# Patient Record
Sex: Female | Born: 1970 | Race: White | Hispanic: No | Marital: Married | Smoking: Never smoker
Health system: Southern US, Community
[De-identification: ages and names within clinical notes are randomized; demographics above are authoritative.]

## PROBLEM LIST (undated history)

## (undated) DIAGNOSIS — M810 Age-related osteoporosis without current pathological fracture: Secondary | ICD-10-CM

## (undated) DIAGNOSIS — T8859XA Other complications of anesthesia, initial encounter: Secondary | ICD-10-CM

## (undated) DIAGNOSIS — E349 Endocrine disorder, unspecified: Secondary | ICD-10-CM

## (undated) DIAGNOSIS — K5090 Crohn's disease, unspecified, without complications: Secondary | ICD-10-CM

## (undated) DIAGNOSIS — T4145XA Adverse effect of unspecified anesthetic, initial encounter: Secondary | ICD-10-CM

## (undated) DIAGNOSIS — R011 Cardiac murmur, unspecified: Secondary | ICD-10-CM

## (undated) DIAGNOSIS — K219 Gastro-esophageal reflux disease without esophagitis: Secondary | ICD-10-CM

## (undated) HISTORY — PX: BREAST ENHANCEMENT SURGERY: SHX7

## (undated) HISTORY — DX: Cardiac murmur, unspecified: R01.1

## (undated) HISTORY — PX: ABDOMINAL HYSTERECTOMY: SHX81

## (undated) HISTORY — PX: NASAL SEPTUM SURGERY: SHX37

## (undated) HISTORY — DX: Endocrine disorder, unspecified: E34.9

## (undated) HISTORY — PX: AUGMENTATION MAMMAPLASTY: SUR837

## (undated) HISTORY — DX: Age-related osteoporosis without current pathological fracture: M81.0

## (undated) SURGERY — Surgical Case
Anesthesia: *Unknown

---

## 1997-09-13 ENCOUNTER — Other Ambulatory Visit: Admission: RE | Admit: 1997-09-13 | Discharge: 1997-09-13 | Payer: Self-pay | Admitting: Gynecology

## 1998-05-14 ENCOUNTER — Inpatient Hospital Stay (HOSPITAL_COMMUNITY): Admission: AD | Admit: 1998-05-14 | Discharge: 1998-05-16 | Payer: Self-pay | Admitting: Obstetrics & Gynecology

## 1998-06-12 ENCOUNTER — Other Ambulatory Visit: Admission: RE | Admit: 1998-06-12 | Discharge: 1998-06-12 | Payer: Self-pay | Admitting: Obstetrics & Gynecology

## 1998-11-07 ENCOUNTER — Encounter: Admission: RE | Admit: 1998-11-07 | Discharge: 1999-02-05 | Payer: Self-pay | Admitting: Sports Medicine

## 1998-11-26 ENCOUNTER — Encounter: Payer: Self-pay | Admitting: Emergency Medicine

## 1998-11-26 ENCOUNTER — Emergency Department (HOSPITAL_COMMUNITY): Admission: EM | Admit: 1998-11-26 | Discharge: 1998-11-26 | Payer: Self-pay | Admitting: Emergency Medicine

## 1999-09-23 ENCOUNTER — Other Ambulatory Visit: Admission: RE | Admit: 1999-09-23 | Discharge: 1999-09-23 | Payer: Self-pay | Admitting: Obstetrics and Gynecology

## 2001-03-07 ENCOUNTER — Encounter (INDEPENDENT_AMBULATORY_CARE_PROVIDER_SITE_OTHER): Payer: Self-pay | Admitting: Specialist

## 2001-03-07 ENCOUNTER — Inpatient Hospital Stay (HOSPITAL_COMMUNITY): Admission: AD | Admit: 2001-03-07 | Discharge: 2001-03-09 | Payer: Self-pay | Admitting: Obstetrics and Gynecology

## 2001-04-19 ENCOUNTER — Other Ambulatory Visit: Admission: RE | Admit: 2001-04-19 | Discharge: 2001-04-19 | Payer: Self-pay | Admitting: Obstetrics & Gynecology

## 2002-05-24 ENCOUNTER — Other Ambulatory Visit: Admission: RE | Admit: 2002-05-24 | Discharge: 2002-05-24 | Payer: Self-pay | Admitting: Obstetrics and Gynecology

## 2003-03-14 ENCOUNTER — Encounter: Admission: RE | Admit: 2003-03-14 | Discharge: 2003-03-14 | Payer: Self-pay | Admitting: Obstetrics and Gynecology

## 2003-06-14 ENCOUNTER — Other Ambulatory Visit: Admission: RE | Admit: 2003-06-14 | Discharge: 2003-06-14 | Payer: Self-pay | Admitting: Obstetrics and Gynecology

## 2003-11-15 ENCOUNTER — Encounter: Admission: RE | Admit: 2003-11-15 | Discharge: 2003-11-15 | Payer: Self-pay | Admitting: Allergy and Immunology

## 2004-02-16 ENCOUNTER — Ambulatory Visit: Payer: Self-pay | Admitting: Internal Medicine

## 2004-06-28 ENCOUNTER — Other Ambulatory Visit: Admission: RE | Admit: 2004-06-28 | Discharge: 2004-06-28 | Payer: Self-pay | Admitting: Obstetrics and Gynecology

## 2007-07-19 ENCOUNTER — Encounter: Admission: RE | Admit: 2007-07-19 | Discharge: 2007-07-19 | Payer: Self-pay | Admitting: Internal Medicine

## 2010-08-29 ENCOUNTER — Other Ambulatory Visit: Payer: Self-pay | Admitting: Obstetrics and Gynecology

## 2010-08-29 DIAGNOSIS — D249 Benign neoplasm of unspecified breast: Secondary | ICD-10-CM

## 2010-09-05 ENCOUNTER — Ambulatory Visit
Admission: RE | Admit: 2010-09-05 | Discharge: 2010-09-05 | Disposition: A | Discharge status: Home / Self Care | Payer: BC Managed Care – PPO | Source: Ambulatory Visit | Attending: Obstetrics and Gynecology | Admitting: Obstetrics and Gynecology

## 2010-09-05 DIAGNOSIS — D249 Benign neoplasm of unspecified breast: Secondary | ICD-10-CM

## 2011-04-02 ENCOUNTER — Other Ambulatory Visit: Payer: Self-pay | Admitting: Dermatology

## 2011-09-02 ENCOUNTER — Other Ambulatory Visit: Payer: Self-pay | Admitting: Obstetrics and Gynecology

## 2014-04-07 ENCOUNTER — Encounter (HOSPITAL_COMMUNITY): Payer: Self-pay | Admitting: Obstetrics

## 2014-04-07 ENCOUNTER — Ambulatory Visit (HOSPITAL_COMMUNITY): Payer: BLUE CROSS/BLUE SHIELD | Admitting: Anesthesiology

## 2014-04-07 ENCOUNTER — Encounter (HOSPITAL_COMMUNITY)
Admission: RE | Disposition: A | Discharge status: Home / Self Care | Payer: Self-pay | Source: Ambulatory Visit | Attending: Obstetrics

## 2014-04-07 ENCOUNTER — Ambulatory Visit (HOSPITAL_COMMUNITY)
Admission: RE | Admit: 2014-04-07 | Discharge: 2014-04-07 | Disposition: A | Discharge status: Home / Self Care | Payer: BLUE CROSS/BLUE SHIELD | Source: Ambulatory Visit | Attending: Obstetrics | Admitting: Obstetrics

## 2014-04-07 DIAGNOSIS — Z302 Encounter for sterilization: Secondary | ICD-10-CM | POA: Diagnosis present

## 2014-04-07 DIAGNOSIS — N920 Excessive and frequent menstruation with regular cycle: Secondary | ICD-10-CM | POA: Insufficient documentation

## 2014-04-07 HISTORY — PX: NOVASURE ABLATION: SHX5394

## 2014-04-07 HISTORY — PX: LAPAROSCOPIC TUBAL LIGATION: SHX1937

## 2014-04-07 HISTORY — DX: Crohn's disease, unspecified, without complications: K50.90

## 2014-04-07 LAB — CBC
HCT: 39.9 % (ref 36.0–46.0)
HEMOGLOBIN: 13.6 g/dL (ref 12.0–15.0)
MCH: 31 pg (ref 26.0–34.0)
MCHC: 34.1 g/dL (ref 30.0–36.0)
MCV: 90.9 fL (ref 78.0–100.0)
Platelets: 334 10*3/uL (ref 150–400)
RBC: 4.39 MIL/uL (ref 3.87–5.11)
RDW: 12.9 % (ref 11.5–15.5)
WBC: 11.3 10*3/uL — ABNORMAL HIGH (ref 4.0–10.5)

## 2014-04-07 LAB — PREGNANCY, URINE: Preg Test, Ur: NEGATIVE

## 2014-04-07 SURGERY — LIGATION, FALLOPIAN TUBE, LAPAROSCOPIC
Anesthesia: General

## 2014-04-07 MED ORDER — GLYCOPYRROLATE 0.2 MG/ML IJ SOLN
INTRAMUSCULAR | Status: AC
  Filled 2014-04-07: qty 3

## 2014-04-07 MED ORDER — IBUPROFEN 600 MG PO TABS: 600.0000 mg | ORAL_TABLET | Freq: Four times a day (QID) | ORAL | Status: DC | PRN

## 2014-04-07 MED ORDER — FENTANYL CITRATE 0.05 MG/ML IJ SOLN
INTRAMUSCULAR | Status: AC
  Filled 2014-04-07: qty 2

## 2014-04-07 MED ORDER — ROCURONIUM BROMIDE 100 MG/10ML IV SOLN
INTRAVENOUS | Status: DC | PRN
  Administered 2014-04-07: 30 mg via INTRAVENOUS

## 2014-04-07 MED ORDER — PROPOFOL 10 MG/ML IV BOLUS
INTRAVENOUS | Status: DC | PRN
Start: 2014-04-07 — End: 2014-04-07
  Administered 2014-04-07: 200 mg via INTRAVENOUS

## 2014-04-07 MED ORDER — OXYCODONE-ACETAMINOPHEN 5-325 MG PO TABS
ORAL_TABLET | ORAL | Status: AC
  Filled 2014-04-07: qty 1

## 2014-04-07 MED ORDER — MIDAZOLAM HCL 2 MG/2ML IJ SOLN
INTRAMUSCULAR | Status: AC
  Filled 2014-04-07: qty 2

## 2014-04-07 MED ORDER — ONDANSETRON HCL 4 MG/2ML IJ SOLN
INTRAMUSCULAR | Status: DC | PRN
  Administered 2014-04-07: 4 mg via INTRAVENOUS

## 2014-04-07 MED ORDER — DEXAMETHASONE SODIUM PHOSPHATE 10 MG/ML IJ SOLN
INTRAMUSCULAR | Status: DC | PRN
  Administered 2014-04-07: 10 mg via INTRAVENOUS

## 2014-04-07 MED ORDER — LACTATED RINGERS IV SOLN: INTRAVENOUS | Status: DC

## 2014-04-07 MED ORDER — NEOSTIGMINE METHYLSULFATE 10 MG/10ML IV SOLN
INTRAVENOUS | Status: DC | PRN
  Administered 2014-04-07: 3 mg via INTRAVENOUS

## 2014-04-07 MED ORDER — OXYCODONE-ACETAMINOPHEN 5-325 MG PO TABS
1.0000 | ORAL_TABLET | Freq: Once | ORAL | Status: AC
  Administered 2014-04-07: 1 via ORAL

## 2014-04-07 MED ORDER — LACTATED RINGERS IV SOLN
INTRAVENOUS | Status: DC
  Administered 2014-04-07 (×2): via INTRAVENOUS

## 2014-04-07 MED ORDER — PROPOFOL 10 MG/ML IV BOLUS
INTRAVENOUS | Status: AC
  Filled 2014-04-07: qty 20

## 2014-04-07 MED ORDER — FENTANYL CITRATE 0.05 MG/ML IJ SOLN
INTRAMUSCULAR | Status: DC | PRN
  Administered 2014-04-07: 50 ug via INTRAVENOUS
  Administered 2014-04-07 (×2): 100 ug via INTRAVENOUS
  Administered 2014-04-07: 50 ug via INTRAVENOUS

## 2014-04-07 MED ORDER — SCOPOLAMINE 1 MG/3DAYS TD PT72
1.0000 | MEDICATED_PATCH | Freq: Once | TRANSDERMAL | Status: DC
  Administered 2014-04-07: 1.5 mg via TRANSDERMAL

## 2014-04-07 MED ORDER — KETOROLAC TROMETHAMINE 30 MG/ML IJ SOLN
INTRAMUSCULAR | Status: AC
  Filled 2014-04-07: qty 1

## 2014-04-07 MED ORDER — GLYCOPYRROLATE 0.2 MG/ML IJ SOLN
INTRAMUSCULAR | Status: DC | PRN
  Administered 2014-04-07: 0.6 mg via INTRAVENOUS

## 2014-04-07 MED ORDER — ROCURONIUM BROMIDE 100 MG/10ML IV SOLN
INTRAVENOUS | Status: AC
  Filled 2014-04-07: qty 1

## 2014-04-07 MED ORDER — LIDOCAINE HCL 1 % IJ SOLN
INTRAMUSCULAR | Status: AC
  Filled 2014-04-07: qty 20

## 2014-04-07 MED ORDER — KETOROLAC TROMETHAMINE 30 MG/ML IJ SOLN
INTRAMUSCULAR | Status: DC | PRN
  Administered 2014-04-07: 30 mg via INTRAVENOUS

## 2014-04-07 MED ORDER — OXYCODONE-ACETAMINOPHEN 5-325 MG PO TABS: 1.0000 | ORAL_TABLET | Freq: Four times a day (QID) | ORAL | Status: DC | PRN

## 2014-04-07 MED ORDER — NEOSTIGMINE METHYLSULFATE 10 MG/10ML IV SOLN
INTRAVENOUS | Status: AC
  Filled 2014-04-07: qty 1

## 2014-04-07 MED ORDER — MIDAZOLAM HCL 2 MG/2ML IJ SOLN: 0.5000 mg | Freq: Once | INTRAMUSCULAR | Status: DC | PRN

## 2014-04-07 MED ORDER — BUPIVACAINE HCL (PF) 0.25 % IJ SOLN
INTRAMUSCULAR | Status: DC | PRN
  Administered 2014-04-07: 4 mL

## 2014-04-07 MED ORDER — ACETAMINOPHEN 325 MG PO TABS: 325.0000 mg | ORAL_TABLET | ORAL | Status: DC | PRN

## 2014-04-07 MED ORDER — ONDANSETRON HCL 4 MG/2ML IJ SOLN
INTRAMUSCULAR | Status: AC
  Filled 2014-04-07: qty 2

## 2014-04-07 MED ORDER — LIDOCAINE HCL (CARDIAC) 20 MG/ML IV SOLN
INTRAVENOUS | Status: DC | PRN
  Administered 2014-04-07: 80 mg via INTRAVENOUS

## 2014-04-07 MED ORDER — PROMETHAZINE HCL 25 MG/ML IJ SOLN: 6.2500 mg | INTRAMUSCULAR | Status: DC | PRN

## 2014-04-07 MED ORDER — LIDOCAINE HCL (CARDIAC) 20 MG/ML IV SOLN
INTRAVENOUS | Status: AC
  Filled 2014-04-07: qty 5

## 2014-04-07 MED ORDER — DEXAMETHASONE SODIUM PHOSPHATE 10 MG/ML IJ SOLN
INTRAMUSCULAR | Status: AC
  Filled 2014-04-07: qty 1

## 2014-04-07 MED ORDER — FENTANYL CITRATE 0.05 MG/ML IJ SOLN
25.0000 ug | INTRAMUSCULAR | Status: DC | PRN
  Administered 2014-04-07 (×2): 25 ug via INTRAVENOUS
  Administered 2014-04-07: 50 ug via INTRAVENOUS

## 2014-04-07 MED ORDER — ACETAMINOPHEN 160 MG/5ML PO SOLN: 325.0000 mg | ORAL | Status: DC | PRN

## 2014-04-07 MED ORDER — MEPERIDINE HCL 25 MG/ML IJ SOLN: 6.2500 mg | INTRAMUSCULAR | Status: DC | PRN

## 2014-04-07 MED ORDER — KETOROLAC TROMETHAMINE 30 MG/ML IJ SOLN: 30.0000 mg | Freq: Once | INTRAMUSCULAR | Status: DC | PRN

## 2014-04-07 MED ORDER — SCOPOLAMINE 1 MG/3DAYS TD PT72
MEDICATED_PATCH | TRANSDERMAL | Status: AC
  Administered 2014-04-07: 1.5 mg via TRANSDERMAL
  Filled 2014-04-07: qty 1

## 2014-04-07 MED ORDER — FENTANYL CITRATE 0.05 MG/ML IJ SOLN
INTRAMUSCULAR | Status: AC
  Filled 2014-04-07: qty 5

## 2014-04-07 MED ORDER — MIDAZOLAM HCL 2 MG/2ML IJ SOLN
INTRAMUSCULAR | Status: DC | PRN
  Administered 2014-04-07: 2 mg via INTRAVENOUS

## 2014-04-07 MED ORDER — BUPIVACAINE HCL (PF) 0.25 % IJ SOLN
INTRAMUSCULAR | Status: AC
  Filled 2014-04-07: qty 30

## 2014-04-07 SURGICAL SUPPLY — 29 items
ABLATOR ENDOMETRIAL BIPOLAR (ABLATOR) ×2 IMPLANT
CATH ROBINSON RED A/P 16FR (CATHETERS) ×2 IMPLANT
CLIP FILSHIE TUBAL LIGA STRL (Clip) ×1 IMPLANT
CLOTH BEACON ORANGE TIMEOUT ST (SAFETY) ×2 IMPLANT
CONTAINER PREFILL 10% NBF 60ML (FORM) ×4 IMPLANT
DRSG COVADERM PLUS 2X2 (GAUZE/BANDAGES/DRESSINGS) ×4 IMPLANT
DRSG OPSITE POSTOP 3X4 (GAUZE/BANDAGES/DRESSINGS) ×1 IMPLANT
GLOVE BIO SURGEON STRL SZ 6 (GLOVE) ×2 IMPLANT
GLOVE BIO SURGEON STRL SZ7 (GLOVE) ×1 IMPLANT
GLOVE BIOGEL PI IND STRL 6 (GLOVE) ×1 IMPLANT
GLOVE BIOGEL PI IND STRL 7.0 (GLOVE) IMPLANT
GLOVE BIOGEL PI INDICATOR 6 (GLOVE) ×1
GLOVE BIOGEL PI INDICATOR 7.0 (GLOVE) ×1
GLOVE INDICATOR 6.5 STRL GRN (GLOVE) ×2 IMPLANT
GLOVE INDICATOR 7.0 STRL GRN (GLOVE) ×4 IMPLANT
GOWN STRL REUS W/TWL LRG LVL3 (GOWN DISPOSABLE) ×6 IMPLANT
LIQUID BAND (GAUZE/BANDAGES/DRESSINGS) ×2 IMPLANT
PACK LAPAROSCOPY BASIN (CUSTOM PROCEDURE TRAY) ×2 IMPLANT
PACK VAGINAL MINOR WOMEN LF (CUSTOM PROCEDURE TRAY) ×2 IMPLANT
PAD POSITIONER PINK NONSTERILE (MISCELLANEOUS) ×2 IMPLANT
SUT MNCRL AB 4-0 PS2 18 (SUTURE) ×1 IMPLANT
SUT VICRYL 0 UR6 27IN ABS (SUTURE) ×1 IMPLANT
TOWEL OR 17X24 6PK STRL BLUE (TOWEL DISPOSABLE) ×4 IMPLANT
TROCAR XCEL NON-BLD 11X100MML (ENDOMECHANICALS) ×2 IMPLANT
TROCAR XCEL NON-BLD 5MMX100MML (ENDOMECHANICALS) IMPLANT
TUBING AQUILEX INFLOW (TUBING) ×1 IMPLANT
TUBING AQUILEX OUTFLOW (TUBING) ×1 IMPLANT
WARMER LAPAROSCOPE (MISCELLANEOUS) ×2 IMPLANT
WATER STERILE IRR 1000ML POUR (IV SOLUTION) ×2 IMPLANT

## 2014-04-07 NOTE — H&P (Signed)
44 y.o.  G3P2 who presents to discuss endometrial ablation and permanent sterilization.  Long h/o menorrhagia. Bleeding became heavy after kids, very cramping. Initially tried Mirena IUD, was not satisfied with this option. Started on cont OCPs, worked well initially.  Now with bleeding more often than not. Can last 1-3 days. Bleeds about 25-50% of days of month. Can be heavy or light  Bothersome for at least last 6 to 12 mo. Does not want to take continuous pills, not interested in trying IUD again or injections.  Had hysteroscopy/D&C for suspected endometrial polyp. endometrial curettings were benign (polyp and proliferative endometrium, no hyperplasia or atypia).  Does not desire future fertility, desires permanent sterilization.    Past Medical History  Diagnosis Date  . Crohn disease     Past Surgical History  Procedure Laterality Date  . Breast enhancement surgery      OB History  No data available    History   Social History  . Marital Status: Married    Spouse Name: N/A  . Number of Children: N/A  . Years of Education: N/A   Occupational History  . Not on file.   Social History Main Topics  . Smoking status: Not on file  . Smokeless tobacco: Not on file  . Alcohol Use: Not on file  . Drug Use: Not on file  . Sexual Activity: Yes   Other Topics Concern  . Not on file   Social History Narrative   Review of patient's allergies indicates no known allergies.        Filed Vitals:   04/07/14 1022  BP: 133/90  Pulse: 85  Temp: 97.9 F (36.6 C)  Resp: 18     General:  NAD Ext: no edema    A/P   44 y.o. [id number] with menorrhagia, failed Mirena, irregular bleeding on OCPs. Desires more permanent solution. Discussed that while amenorrhea does occur in 40-60% of women following this procedure, our overall goal is at least resumption of normal menses with decreased bleeding. The overall success, when define as return to normal menses, would be expected to be about  90%. Previous normal uterine sampling after D&C. Reviewed again that a result of ablation can be cervical stenosis, which could potentially result in a delay of diagnosis of cancer or hyperplasia. She willing to take that risk and would like to proceed.  She also desires permanent sterilization. Is aware of the risks of pregnancy after an ablation.  Unable to proceed with salpingectomy due to insurance coverage, so plan for l/s BTL today Reviewed the risks of bleeding, infection, uterine perforation, inability to complete the procedure due to cavity size, failure of procedure with need for additional surgical management in the future, potential cervical stenosis of post tubal ablation syndrome requiring additional procedures in the future, and risk of damage to other structures.  Consent signed.  No antibiotics indicated.  D/C home from PACU with motrin, percocet and plan for f/u in 2 wks for post operative visit.  UPT negative      [place], [facility]

## 2014-04-07 NOTE — Transfer of Care (Signed)
Immediate Anesthesia Transfer of Care Note  Patient: Kelly Calhoun  Procedure(s) Performed: Procedure(s): LAPAROSCOPIC TUBAL LIGATION (N/A) NOVASURE ABLATION (N/A)  Patient Location: PACU  Anesthesia Type:General  Level of Consciousness: awake, alert , oriented and patient cooperative  Airway & Oxygen Therapy: Patient Spontanous Breathing and Patient connected to nasal cannula oxygen  Post-op Assessment: Report given to RN, Post -op Vital signs reviewed and stable and Patient moving all extremities  Post vital signs: Reviewed and stable  Last Vitals:  Filed Vitals:   04/07/14 1022  BP: 133/90  Pulse: 85  Temp: 36.6 C  Resp: 18    Complications: No apparent anesthesia complications

## 2014-04-07 NOTE — Discharge Instructions (Signed)
Pelvic rest x 2 weeks (no intercourse or tampons).  No tub baths or swimming for two weeks.    Do not drive until you are not taking narcotic pain medication.   Call your doctor if you have heavy vaginal bleeding (soaking through a pad an hour or more for >2 hours in a row), temperature >101F, severe nausea, vomiting, severe or worsening abdominal pain, dizziness, shortness of breath, chest pain or any other concerns.  Please take motrin every 6 hours.  Add percocet if your pain is not controlled by motrin alone.  Post Anesthesia Home Care Instructions  Activity: Get plenty of rest for the remainder of the day. A responsible adult should stay with you for 24 hours following the procedure.  For the next 24 hours, DO NOT: -Drive a car -Paediatric nurse -Drink alcoholic beverages -Take any medication unless instructed by your physician -Make any legal decisions or sign important papers.  Meals: Start with liquid foods such as gelatin or soup. Progress to regular foods as tolerated. Avoid greasy, spicy, heavy foods. If nausea and/or vomiting occur, drink only clear liquids until the nausea and/or vomiting subsides. Call your physician if vomiting continues.  Special Instructions/Symptoms: Your throat may feel dry or sore from the anesthesia or the breathing tube placed in your throat during surgery. If this causes discomfort, gargle with warm salt water. The discomfort should disappear within 24 hours.

## 2014-04-07 NOTE — Brief Op Note (Signed)
04/07/2014  12:26 PM  PATIENT:  Kelly Calhoun  44 y.o. female  PRE-OPERATIVE DIAGNOSIS:  MENORRHAGIA / DESIRES STERILIZATION  POST-OPERATIVE DIAGNOSIS:  MENORRHAGIA / [place]:  Procedure(s): LAPAROSCOPIC TUBAL LIGATION (N/A) NOVASURE ABLATION (N/A)  SURGEON:  Surgeon(s) and Role:    * Jerelyn Charles, MD - Primary  ANESTHESIA:   general  EBL:  Total I/O In: 1400 [I.V.:1400] Out: 20 [Urine:20]  BLOOD ADMINISTERED:none  DRAINS: none   LOCAL MEDICATIONS USED:  MARCAINE     SPECIMEN:  No Specimen  DISPOSITION OF SPECIMEN:  N/A  COUNTS:  YES  TOURNIQUET:  * No tourniquets in log *  DICTATION: .Note written in EPIC  PLAN OF CARE: Discharge to home after PACU  PATIENT DISPOSITION:  PACU - hemodynamically stable.   Delay start of Pharmacological VTE agent (>24hrs) due to surgical blood loss or risk of bleeding: not applicable

## 2014-04-07 NOTE — Op Note (Signed)
Pre-Operative Diagnosis: 1. Menorrhagia 2. desires permanent sterilization  Postoperative Diagnosis: Same  Procedure: Laparoscopic bilateral tubal ligation with Filshie clips and NovaSure endometrial ablation  Surgeon: Dr. Jerelyn Charles  Assistant: None  Anesthesia: General endotracheal anesthesia and 4 cc of 0.25% Marcaine injected infraumbilically.  Operative Findings:Normal appearing ovaries, tubes, and uterus.  Filmy adhesion of right colon to right abdominal sidewall just inferior to liver.  Otherwise normal appearing upper abdomen.   Uterus sounded to 9 cm, 4.5cm cavity length and 2.9 cm cavity width.  65 second burn time.   Specimen:None  VZD:GLOVFIE  Patient with known menorrhagia and a desire for permanent sterilization.  She previously had an in office hysteroscopy and D&C that confirmed normal cavity.  Endometiral curettings from this procedure returned benign.  She was counseled in a preoperative visit regarding risks, benefits and alternatives.  Following the appropriate informed consent the patient was taken to the operating room. She was placed in dorsal lithotomy position, and general anesthesia was administered. The abdomen, perineum, and vagina, were prepped in the normal sterile fashion.  The bladder was drained with a red rubber catheter.  Speculum was placed in the vagina and a Hulka uterine manipulator was placed transcervically. The speculum was removed from the vagina. Attention was then turned to the abdominal portion of the case. Following the appropriate draping, 4 cc of 0.25% Marcaine was injected infraumbilically. An incision was made at the base of the umbilicus with a scalpel. The 72mm Visiport was used to enter the abdomen under direct visualization. Entry was confirmed and the abdomen was insufflated with  CO2 gas.  The intra-abdominal cavity was inspected and findings were as above. The uterus was elevated and the patient was placed in Trendelenburg.  The operative  scope was inserted into the abdomen with a Filshie clip.  . The right fallopian tube was identified and a Filshie clip was applied circumferentially to the fallopian tube. The same procedure was repeated on the left side. This completed the procedure. At this time, the abdomen was desufflated.    Attention was turned to the vaginal portion of the procedure.  The uterine manipulator was removed.   A speculum was placed into the vagina, a single-tooth tenaculum was placed on the anterior lip of the cervix.  The cervix was serially dilated with Kennon Rounds dilators to 65mm.  Under direct visualization, the hysteroscope was advanced.  The uterine cavity was surveyed. Both tubal ostia were identified. The endometrium was noted to be relatively thin without any polyps or fibroids. The uterus was sounded to 9.0 cm.  The cervical length was 4.5 cm, for a total cavity length of 4.5 cm.  The uterine cavity length was registered on the instrument panel. The array was deployed, introduced into the uterus, and the uterine cavity width was measured at 2.9cm and entered on the instrument panel. The cavity assessment was successfully completed followed by initiation of the ablation cycle which was completed without difficulty.  Total time for ablation was 1 minute and 5 seconds.  Repeat hysteroscopy showed complete ablation of the endometrium with the exception of a < 1 cm area at the fundus. All the vaginal instruments were removed.  At this time, the port was removed from the abdomen. The fascia was closed with 0-Vicryl in with a figure of eight stitch.  The skin was closed in a subcuticular fashion with 4-0 moncryl.  The uterine manipulator was removed from the vagina. All sponge lap needle counts were correct x2.  The  patient tolerated the procedure well and was brought to the recovery room in stable condition

## 2014-04-07 NOTE — Anesthesia Procedure Notes (Signed)
Procedure Name: Intubation Date/Time: 04/07/2014 11:20 AM Performed by: [place] Ravens HRISTOVA Pre-anesthesia Checklist: Patient identified, Emergency Drugs available, Suction available and Patient being monitored Patient Re-evaluated:Patient Re-evaluated prior to inductionOxygen Delivery Method: Circle system utilized Preoxygenation: Pre-oxygenation with 100% oxygen Intubation Type: IV induction Ventilation: Mask ventilation without difficulty Grade View: Grade I Tube size: 7.0 mm Number of attempts: 1 Airway Equipment and Method: Stylet Placement Confirmation: ETT inserted through vocal cords under direct vision,  positive ETCO2 and breath sounds checked- equal and bilateral Secured at: 21 cm Tube secured with: Tape Dental Injury: Teeth and Oropharynx as per pre-operative assessment

## 2014-04-07 NOTE — Anesthesia Preprocedure Evaluation (Signed)
Anesthesia Evaluation  Patient identified by MRN, date of birth, ID band Patient awake    Reviewed: Allergy & Precautions, H&P , Patient's Chart, lab work & pertinent test results, reviewed documented beta blocker date and time   History of Anesthesia Complications Negative for: history of anesthetic complications  Airway Mallampati: II  TM Distance: >3 FB Neck ROM: full    Dental   Pulmonary  breath sounds clear to auscultation        Cardiovascular Exercise Tolerance: Good Rhythm:regular Rate:Normal     Neuro/Psych    GI/Hepatic   Endo/Other    Renal/GU      Musculoskeletal   Abdominal   Peds  Hematology   Anesthesia Other Findings crohns disease  Reproductive/Obstetrics                             Anesthesia Physical Anesthesia Plan  ASA: III  Anesthesia Plan: General ETT   Post-op Pain Management:    Induction:   Airway Management Planned:   Additional Equipment:   Intra-op Plan:   Post-operative Plan:   Informed Consent: I have reviewed the patients History and Physical, chart, labs and discussed the procedure including the risks, benefits and alternatives for the proposed anesthesia with the patient or authorized representative who has indicated his/her understanding and acceptance.   Dental Advisory Given  Plan Discussed with: CRNA and Surgeon  Anesthesia Plan Comments:         Anesthesia Quick Evaluation

## 2014-04-07 NOTE — Anesthesia Postprocedure Evaluation (Signed)
Anesthesia Post Note  Patient: Kelly Calhoun  Procedure(s) Performed: Procedure(s) (LRB): LAPAROSCOPIC TUBAL LIGATION (N/A) NOVASURE ABLATION (N/A)  Anesthesia type: GA  Patient location: PACU  Post pain: Pain level controlled  Post assessment: Post-op Vital signs reviewed  Last Vitals:  Filed Vitals:   04/07/14 1230  BP:   Pulse:   Temp: 36.8 C  Resp:     Post vital signs: Reviewed  Level of consciousness: sedated  Complications: No apparent anesthesia complications

## 2014-04-11 ENCOUNTER — Encounter (HOSPITAL_COMMUNITY): Payer: Self-pay | Admitting: Obstetrics

## 2015-01-15 ENCOUNTER — Other Ambulatory Visit: Payer: Self-pay | Admitting: Gastroenterology

## 2015-07-19 DIAGNOSIS — N393 Stress incontinence (female) (male): Secondary | ICD-10-CM | POA: Diagnosis not present

## 2015-08-16 DIAGNOSIS — L42 Pityriasis rosea: Secondary | ICD-10-CM | POA: Diagnosis not present

## 2015-08-23 DIAGNOSIS — N814 Uterovaginal prolapse, unspecified: Secondary | ICD-10-CM | POA: Diagnosis not present

## 2015-08-29 NOTE — Patient Instructions (Addendum)
Your procedure is scheduled on:  Thursday, September 13, 2015  Enter through the Main Entrance of [facility] at:  6:00 AM  Pick up the phone at the desk and dial [phone].  Call this number if you have problems the morning of surgery: [phone].  Remember: Do NOT eat food or drink after:  Midnight Wednesday  Take these medicines the morning of surgery with a SIP OF WATER:  [place] Do NOT wear jewelry (body piercing), metal hair clips/bobby pins, make-up, or nail polish. Do NOT wear lotions, powders, or perfumes.  You may wear deodorant. Do NOT shave for 48 hours prior to surgery. Do NOT bring valuables to the hospital. Contacts, dentures, or bridgework may not be worn into surgery.  Leave suitcase in car.  After surgery it may be brought to your room.  For patients admitted to the hospital, checkout time is 11:00 AM the day of discharge.

## 2015-08-30 ENCOUNTER — Encounter (HOSPITAL_COMMUNITY)
Admission: RE | Admit: 2015-08-30 | Discharge: 2015-08-30 | Disposition: A | Discharge status: Home / Self Care | Payer: BLUE CROSS/BLUE SHIELD | Source: Ambulatory Visit | Attending: Obstetrics | Admitting: Obstetrics

## 2015-08-30 ENCOUNTER — Encounter (HOSPITAL_COMMUNITY): Payer: Self-pay

## 2015-08-30 DIAGNOSIS — Z01812 Encounter for preprocedural laboratory examination: Secondary | ICD-10-CM | POA: Insufficient documentation

## 2015-08-30 DIAGNOSIS — N926 Irregular menstruation, unspecified: Secondary | ICD-10-CM | POA: Diagnosis not present

## 2015-08-30 HISTORY — DX: Other complications of anesthesia, initial encounter: T88.59XA

## 2015-08-30 HISTORY — DX: Gastro-esophageal reflux disease without esophagitis: K21.9

## 2015-08-30 HISTORY — DX: Adverse effect of unspecified anesthetic, initial encounter: T41.45XA

## 2015-08-30 LAB — CBC
HEMATOCRIT: 38.8 % (ref 36.0–46.0)
Hemoglobin: 13.5 g/dL (ref 12.0–15.0)
MCH: 31.8 pg (ref 26.0–34.0)
MCHC: 34.8 g/dL (ref 30.0–36.0)
MCV: 91.3 fL (ref 78.0–100.0)
PLATELETS: 264 10*3/uL (ref 150–400)
RBC: 4.25 MIL/uL (ref 3.87–5.11)
RDW: 13.3 % (ref 11.5–15.5)
WBC: 7.7 10*3/uL (ref 4.0–10.5)

## 2015-09-13 ENCOUNTER — Ambulatory Visit (HOSPITAL_COMMUNITY): Payer: BLUE CROSS/BLUE SHIELD | Admitting: Anesthesiology

## 2015-09-13 ENCOUNTER — Encounter (HOSPITAL_COMMUNITY)
Admission: RE | Disposition: A | Discharge status: Home / Self Care | Payer: Self-pay | Source: Ambulatory Visit | Attending: Obstetrics

## 2015-09-13 ENCOUNTER — Encounter (HOSPITAL_COMMUNITY): Payer: Self-pay

## 2015-09-13 ENCOUNTER — Observation Stay (HOSPITAL_COMMUNITY)
Admission: RE | Admit: 2015-09-13 | Discharge: 2015-09-14 | Disposition: A | Discharge status: Home / Self Care | Payer: BLUE CROSS/BLUE SHIELD | Source: Ambulatory Visit | Attending: Obstetrics | Admitting: Obstetrics

## 2015-09-13 DIAGNOSIS — N814 Uterovaginal prolapse, unspecified: Principal | ICD-10-CM | POA: Diagnosis present

## 2015-09-13 DIAGNOSIS — N800 Endometriosis of uterus: Secondary | ICD-10-CM | POA: Insufficient documentation

## 2015-09-13 DIAGNOSIS — K219 Gastro-esophageal reflux disease without esophagitis: Secondary | ICD-10-CM | POA: Diagnosis not present

## 2015-09-13 DIAGNOSIS — N393 Stress incontinence (female) (male): Secondary | ICD-10-CM | POA: Diagnosis not present

## 2015-09-13 DIAGNOSIS — N888 Other specified noninflammatory disorders of cervix uteri: Secondary | ICD-10-CM | POA: Insufficient documentation

## 2015-09-13 DIAGNOSIS — N819 Female genital prolapse, unspecified: Secondary | ICD-10-CM | POA: Diagnosis not present

## 2015-09-13 HISTORY — PX: VAGINAL HYSTERECTOMY: SHX2639

## 2015-09-13 HISTORY — PX: CYSTOCELE REPAIR: SHX163

## 2015-09-13 HISTORY — PX: BLADDER SUSPENSION: SHX72

## 2015-09-13 HISTORY — PX: CYSTOSCOPY: SHX5120

## 2015-09-13 HISTORY — PX: BILATERAL SALPINGECTOMY: SHX5743

## 2015-09-13 LAB — BASIC METABOLIC PANEL
Anion gap: 8 (ref 5–15)
BUN: 11 mg/dL (ref 6–20)
CALCIUM: 9.7 mg/dL (ref 8.9–10.3)
CO2: 24 mmol/L (ref 22–32)
Chloride: 109 mmol/L (ref 101–111)
Creatinine, Ser: 0.74 mg/dL (ref 0.44–1.00)
GFR calc Af Amer: >60 mL/min (ref >60)
GFR calc non Af Amer: >60 mL/min (ref >60)
Glucose, Bld: 86 mg/dL (ref 65–99)
Potassium: 3.6 mmol/L (ref 3.5–5.1)
Sodium: 141 mmol/L (ref 135–145)

## 2015-09-13 LAB — ABO/RH: ABO/RH(D): A POS

## 2015-09-13 LAB — PREGNANCY, URINE: PREG TEST UR: NEGATIVE

## 2015-09-13 LAB — TYPE AND SCREEN
ABO/RH(D): A POS
Antibody Screen: NEGATIVE

## 2015-09-13 SURGERY — HYSTERECTOMY, VAGINAL
Anesthesia: General

## 2015-09-13 MED ORDER — HYDROMORPHONE HCL 1 MG/ML IJ SOLN
INTRAMUSCULAR | Status: AC
  Filled 2015-09-13: qty 1

## 2015-09-13 MED ORDER — ONDANSETRON HCL 4 MG/2ML IJ SOLN
INTRAMUSCULAR | Status: AC
  Filled 2015-09-13: qty 2

## 2015-09-13 MED ORDER — FENTANYL CITRATE (PF) 100 MCG/2ML IJ SOLN
INTRAMUSCULAR | Status: DC | PRN
  Administered 2015-09-13: 50 ug via INTRAVENOUS
  Administered 2015-09-13: 100 ug via INTRAVENOUS
  Administered 2015-09-13 (×3): 50 ug via INTRAVENOUS

## 2015-09-13 MED ORDER — IBUPROFEN 600 MG PO TABS
600.0000 mg | ORAL_TABLET | Freq: Four times a day (QID) | ORAL | Status: DC | PRN
  Administered 2015-09-13 – 2015-09-14 (×3): 600 mg via ORAL
  Filled 2015-09-13 (×4): qty 1

## 2015-09-13 MED ORDER — ONDANSETRON HCL 4 MG/2ML IJ SOLN: 4.0000 mg | Freq: Four times a day (QID) | INTRAMUSCULAR | Status: DC | PRN

## 2015-09-13 MED ORDER — MENTHOL 3 MG MT LOZG: 1.0000 | LOZENGE | OROMUCOSAL | Status: DC | PRN

## 2015-09-13 MED ORDER — ACETAMINOPHEN 10 MG/ML IV SOLN
1000.0000 mg | Freq: Once | INTRAVENOUS | Status: AC
  Administered 2015-09-13: 1000 mg via INTRAVENOUS
  Filled 2015-09-13: qty 100

## 2015-09-13 MED ORDER — SCOPOLAMINE 1 MG/3DAYS TD PT72
1.0000 | MEDICATED_PATCH | Freq: Once | TRANSDERMAL | Status: DC
  Administered 2015-09-13: 1.5 mg via TRANSDERMAL

## 2015-09-13 MED ORDER — FENTANYL CITRATE (PF) 100 MCG/2ML IJ SOLN
INTRAMUSCULAR | Status: AC
  Filled 2015-09-13: qty 2

## 2015-09-13 MED ORDER — ESTRADIOL 0.1 MG/GM VA CREA
TOPICAL_CREAM | VAGINAL | Status: DC | PRN
  Administered 2015-09-13: 1 via VAGINAL

## 2015-09-13 MED ORDER — MESALAMINE 400 MG PO CPDR
800.0000 mg | DELAYED_RELEASE_CAPSULE | Freq: Two times a day (BID) | ORAL | Status: DC
  Administered 2015-09-13 – 2015-09-14 (×2): 800 mg via ORAL
  Filled 2015-09-13 (×4): qty 2

## 2015-09-13 MED ORDER — STERILE WATER FOR IRRIGATION IR SOLN
Status: DC | PRN
  Administered 2015-09-13: 1000 mL

## 2015-09-13 MED ORDER — PROPOFOL 500 MG/50ML IV EMUL
INTRAVENOUS | Status: DC | PRN
  Administered 2015-09-13: 150 ug/kg/min via INTRAVENOUS

## 2015-09-13 MED ORDER — SIMETHICONE 80 MG PO CHEW
80.0000 mg | CHEWABLE_TABLET | Freq: Four times a day (QID) | ORAL | Status: DC | PRN
  Filled 2015-09-13: qty 1

## 2015-09-13 MED ORDER — CEFAZOLIN SODIUM-DEXTROSE 2-4 GM/100ML-% IV SOLN
2.0000 g | INTRAVENOUS | Status: AC
  Administered 2015-09-13: 2 g via INTRAVENOUS

## 2015-09-13 MED ORDER — MESALAMINE 400 MG PO CPDR
800.0000 mg | DELAYED_RELEASE_CAPSULE | Freq: Two times a day (BID) | ORAL | Status: DC
Start: 2015-09-14 — End: 2015-09-13
  Filled 2015-09-13 (×2): qty 2

## 2015-09-13 MED ORDER — MIDAZOLAM HCL 5 MG/5ML IJ SOLN
INTRAMUSCULAR | Status: DC | PRN
  Administered 2015-09-13: 2 mg via INTRAVENOUS

## 2015-09-13 MED ORDER — MEPERIDINE HCL 25 MG/ML IJ SOLN: 6.2500 mg | INTRAMUSCULAR | Status: DC | PRN

## 2015-09-13 MED ORDER — LIDOCAINE-EPINEPHRINE 0.5 %-1:200000 IJ SOLN
INTRAMUSCULAR | Status: AC
  Filled 2015-09-13: qty 1

## 2015-09-13 MED ORDER — SUGAMMADEX SODIUM 200 MG/2ML IV SOLN
INTRAVENOUS | Status: AC
  Filled 2015-09-13: qty 2

## 2015-09-13 MED ORDER — OXYCODONE HCL 5 MG PO TABS: 5.0000 mg | ORAL_TABLET | ORAL | Status: DC | PRN

## 2015-09-13 MED ORDER — HYDROMORPHONE HCL 1 MG/ML IJ SOLN
INTRAMUSCULAR | Status: DC | PRN
  Administered 2015-09-13 (×2): 0.5 mg via INTRAVENOUS
  Administered 2015-09-13: 1 mg via INTRAVENOUS

## 2015-09-13 MED ORDER — SCOPOLAMINE 1 MG/3DAYS TD PT72
MEDICATED_PATCH | TRANSDERMAL | Status: AC
  Administered 2015-09-13: 1.5 mg via TRANSDERMAL
  Filled 2015-09-13: qty 1

## 2015-09-13 MED ORDER — VASOPRESSIN 20 UNIT/ML IV SOLN
INTRAVENOUS | Status: AC
  Filled 2015-09-13: qty 1

## 2015-09-13 MED ORDER — ONDANSETRON HCL 4 MG PO TABS: 4.0000 mg | ORAL_TABLET | Freq: Four times a day (QID) | ORAL | Status: DC | PRN

## 2015-09-13 MED ORDER — ACETAMINOPHEN 500 MG PO TABS
1000.0000 mg | ORAL_TABLET | Freq: Four times a day (QID) | ORAL | Status: DC | PRN
  Administered 2015-09-13: 1000 mg via ORAL
  Filled 2015-09-13: qty 2

## 2015-09-13 MED ORDER — ROCURONIUM BROMIDE 100 MG/10ML IV SOLN
INTRAVENOUS | Status: AC
  Filled 2015-09-13: qty 1

## 2015-09-13 MED ORDER — LIDOCAINE-EPINEPHRINE (PF) 1 %-1:200000 IJ SOLN
INTRAMUSCULAR | Status: AC
  Filled 2015-09-13: qty 30

## 2015-09-13 MED ORDER — ROCURONIUM BROMIDE 100 MG/10ML IV SOLN
INTRAVENOUS | Status: DC | PRN
  Administered 2015-09-13: 10 mg via INTRAVENOUS
  Administered 2015-09-13: 50 mg via INTRAVENOUS
  Administered 2015-09-13: 10 mg via INTRAVENOUS

## 2015-09-13 MED ORDER — ONDANSETRON HCL 4 MG/2ML IJ SOLN
INTRAMUSCULAR | Status: DC | PRN
  Administered 2015-09-13: 4 mg via INTRAVENOUS

## 2015-09-13 MED ORDER — LIDOCAINE HCL (CARDIAC) 20 MG/ML IV SOLN
INTRAVENOUS | Status: DC | PRN
  Administered 2015-09-13: 60 mg via INTRAVENOUS

## 2015-09-13 MED ORDER — LIDOCAINE HCL (CARDIAC) 20 MG/ML IV SOLN
INTRAVENOUS | Status: AC
  Filled 2015-09-13: qty 5

## 2015-09-13 MED ORDER — SUGAMMADEX SODIUM 200 MG/2ML IV SOLN
INTRAVENOUS | Status: DC | PRN
  Administered 2015-09-13: 120 mg via INTRAVENOUS

## 2015-09-13 MED ORDER — ESTRADIOL 0.1 MG/GM VA CREA
TOPICAL_CREAM | VAGINAL | Status: AC
  Filled 2015-09-13: qty 42.5

## 2015-09-13 MED ORDER — LACTATED RINGERS IV SOLN
INTRAVENOUS | Status: DC
  Administered 2015-09-13 – 2015-09-14 (×3): via INTRAVENOUS

## 2015-09-13 MED ORDER — HYDROMORPHONE HCL 1 MG/ML IJ SOLN
INTRAMUSCULAR | Status: AC
  Administered 2015-09-13: 0.25 mg via INTRAVENOUS
  Filled 2015-09-13: qty 1

## 2015-09-13 MED ORDER — POLYETHYLENE GLYCOL 3350 17 G PO PACK
17.0000 g | PACK | Freq: Every day | ORAL | Status: DC | PRN
  Filled 2015-09-13: qty 1

## 2015-09-13 MED ORDER — PROPOFOL 500 MG/50ML IV EMUL
INTRAVENOUS | Status: AC
  Filled 2015-09-13: qty 50

## 2015-09-13 MED ORDER — PROPOFOL 10 MG/ML IV BOLUS
INTRAVENOUS | Status: AC
  Filled 2015-09-13: qty 80

## 2015-09-13 MED ORDER — DEXAMETHASONE SODIUM PHOSPHATE 10 MG/ML IJ SOLN
INTRAMUSCULAR | Status: AC
  Filled 2015-09-13: qty 1

## 2015-09-13 MED ORDER — DEXAMETHASONE SODIUM PHOSPHATE 10 MG/ML IJ SOLN
INTRAMUSCULAR | Status: DC | PRN
  Administered 2015-09-13: 10 mg via INTRAVENOUS

## 2015-09-13 MED ORDER — VASOPRESSIN 20 UNIT/ML IV SOLN
INTRAVENOUS | Status: DC | PRN
  Administered 2015-09-13: 10 mL via INTRAMUSCULAR
  Administered 2015-09-13: 7 mL via INTRAMUSCULAR

## 2015-09-13 MED ORDER — LACTATED RINGERS IV SOLN: INTRAVENOUS | Status: DC

## 2015-09-13 MED ORDER — GELATIN ABSORBABLE 12-7 MM EX MISC
CUTANEOUS | Status: DC | PRN
  Administered 2015-09-13: 1

## 2015-09-13 MED ORDER — SODIUM CHLORIDE 0.9 % IJ SOLN
INTRAMUSCULAR | Status: AC
  Filled 2015-09-13: qty 50

## 2015-09-13 MED ORDER — PROPOFOL 10 MG/ML IV BOLUS
INTRAVENOUS | Status: DC | PRN
  Administered 2015-09-13: 40 mg via INTRAVENOUS
  Administered 2015-09-13: 200 mg via INTRAVENOUS

## 2015-09-13 MED ORDER — KETOROLAC TROMETHAMINE 30 MG/ML IJ SOLN
INTRAMUSCULAR | Status: AC
  Filled 2015-09-13: qty 1

## 2015-09-13 MED ORDER — PROMETHAZINE HCL 25 MG/ML IJ SOLN: 6.2500 mg | INTRAMUSCULAR | Status: DC | PRN

## 2015-09-13 MED ORDER — DOCUSATE SODIUM 100 MG PO CAPS
100.0000 mg | ORAL_CAPSULE | Freq: Two times a day (BID) | ORAL | Status: DC
  Filled 2015-09-13 (×6): qty 1

## 2015-09-13 MED ORDER — HYDROMORPHONE HCL 1 MG/ML IJ SOLN
0.2500 mg | INTRAMUSCULAR | Status: DC | PRN
  Administered 2015-09-13 (×3): 0.25 mg via INTRAVENOUS

## 2015-09-13 MED ORDER — KETOROLAC TROMETHAMINE 30 MG/ML IJ SOLN
INTRAMUSCULAR | Status: DC | PRN
  Administered 2015-09-13: 30 mg via INTRAVENOUS

## 2015-09-13 MED ORDER — LIDOCAINE HCL 1 % IJ SOLN
INTRAMUSCULAR | Status: AC
  Filled 2015-09-13: qty 20

## 2015-09-13 MED ORDER — LACTATED RINGERS IV SOLN
INTRAVENOUS | Status: DC
  Administered 2015-09-13 (×3): via INTRAVENOUS

## 2015-09-13 MED ORDER — MIDAZOLAM HCL 2 MG/2ML IJ SOLN
INTRAMUSCULAR | Status: AC
  Filled 2015-09-13: qty 2

## 2015-09-13 SURGICAL SUPPLY — 42 items
BLADE SURG 15 STRL LF C SS BP (BLADE) ×2 IMPLANT
BLADE SURG 15 STRL SS (BLADE) ×3
CANISTER SUCT 3000ML (MISCELLANEOUS) ×5 IMPLANT
CATH BONANNO SUPRAPUBIC 14G (CATHETERS) IMPLANT
CLOTH BEACON ORANGE TIMEOUT ST (SAFETY) ×5 IMPLANT
CONT PATH 16OZ SNAP LID 3702 (MISCELLANEOUS) IMPLANT
DECANTER SPIKE VIAL GLASS SM (MISCELLANEOUS) ×1 IMPLANT
GAUZE PACKING 1 X5 YD ST (GAUZE/BANDAGES/DRESSINGS) IMPLANT
GAUZE PACKING 2X5 YD STRL (GAUZE/BANDAGES/DRESSINGS) ×2 IMPLANT
GLOVE BIO SURGEON STRL SZ 6 (GLOVE) ×3 IMPLANT
GLOVE BIO SURGEON STRL SZ7 (GLOVE) ×3 IMPLANT
GLOVE BIOGEL PI IND STRL 6.5 (GLOVE) ×2 IMPLANT
GLOVE BIOGEL PI IND STRL 7.0 (GLOVE) ×4 IMPLANT
GLOVE BIOGEL PI INDICATOR 6.5 (GLOVE) ×1
GLOVE BIOGEL PI INDICATOR 7.0 (GLOVE) ×2
GOWN STRL REUS W/TWL LRG LVL3 (GOWN DISPOSABLE) ×16 IMPLANT
LIQUID BAND (GAUZE/BANDAGES/DRESSINGS) ×3 IMPLANT
NDL SPNL 22GX3.5 QUINCKE BK (NEEDLE) IMPLANT
NEEDLE HYPO 22GX1.5 SAFETY (NEEDLE) ×3 IMPLANT
NEEDLE SPNL 22GX3.5 QUINCKE BK (NEEDLE) IMPLANT
NS IRRIG 1000ML POUR BTL (IV SOLUTION) ×5 IMPLANT
PACK VAGINAL WOMENS (CUSTOM PROCEDURE TRAY) ×6 IMPLANT
PAD OB MATERNITY 4.3X12.25 (PERSONAL CARE ITEMS) ×3 IMPLANT
PLUG CATH AND CAP STER (CATHETERS) ×3 IMPLANT
SET CYSTO W/LG BORE CLAMP LF (SET/KITS/TRAYS/PACK) ×3 IMPLANT
SLING TRANS VAGINAL TAPE (Sling) ×1 IMPLANT
SLING UTERINE/ABD GYNECARE TVT (Sling) ×2 IMPLANT
SUT VIC AB 0 CT1 18XCR BRD8 (SUTURE) ×2 IMPLANT
SUT VIC AB 0 CT1 27 (SUTURE) ×3
SUT VIC AB 0 CT1 27XBRD ANBCTR (SUTURE) IMPLANT
SUT VIC AB 0 CT1 8-18 (SUTURE)
SUT VIC AB 2-0 CT1 (SUTURE) ×2 IMPLANT
SUT VIC AB 2-0 CT1 27 (SUTURE) ×6
SUT VIC AB 2-0 CT1 TAPERPNT 27 (SUTURE) ×8 IMPLANT
SUT VIC AB 2-0 UR6 27 (SUTURE) IMPLANT
SUT VIC AB 3-0 SH 27 (SUTURE) ×9
SUT VIC AB 3-0 SH 27X BRD (SUTURE) IMPLANT
SUT VICRYL 0 TIES 12 18 (SUTURE) IMPLANT
SYR 50ML LL SCALE MARK (SYRINGE) IMPLANT
TOWEL OR 17X24 6PK STRL BLUE (TOWEL DISPOSABLE) ×12 IMPLANT
TRAY FOLEY CATH SILVER 14FR (SET/KITS/TRAYS/PACK) ×5 IMPLANT
WATER STERILE IRR 1000ML POUR (IV SOLUTION) ×3 IMPLANT

## 2015-09-13 NOTE — Anesthesia Postprocedure Evaluation (Signed)
Anesthesia Post Note  Patient: ISABELITA BOTTARI  Procedure(s) Performed: Procedure(s) (LRB): HYSTERECTOMY VAGINAL (N/A) BILATERAL SALPINGECTOMY (Bilateral) CYSTOSCOPY (N/A) TRANSVAGINAL TAPE (TVT) PROCEDURE (N/A) ANTERIOR REPAIR (CYSTOCELE) (N/A)  Patient location during evaluation: [facility] Anesthesia Type: General Level of consciousness: awake Pain management: pain level controlled Vital Signs Assessment: post-procedure vital signs reviewed and stable Respiratory status: spontaneous breathing Cardiovascular status: stable Postop Assessment: adequate PO intake and no signs of nausea or vomiting Anesthetic complications: no     Last Vitals:  Vitals:   09/13/15 1330 09/13/15 1750  BP: 113/68 119/73  Pulse: 78 66  Resp: 16 16  Temp:  36.8 C    Last Pain:  Vitals:   09/13/15 1750  TempSrc: Oral  PainSc:    Pain Goal: Patients Stated Pain Goal: 3 (09/13/15 1745)               Darrien Belter

## 2015-09-13 NOTE — Brief Op Note (Addendum)
09/13/2015  10:07 AM  PATIENT:  Kelly Calhoun  45 y.o. female  PRE-OPERATIVE DIAGNOSIS:  STRESS URINARY INCONTINENCE  UTERINE PROLAPSE, CYSTOCELE  POST-OPERATIVE DIAGNOSIS: same  PROCEDURE:  Procedure(s): HYSTERECTOMY VAGINAL (N/A) BILATERAL SALPINGECTOMY (Bilateral) CYSTOSCOPY (N/A) TRANSVAGINAL TAPE (TVT) PROCEDURE (N/A) ANTERIOR REPAIR (CYSTOCELE) (N/A)  SURGEON:  Surgeon(s) and Role: Panel 1:    * Jerelyn Charles, MD - Primary  Panel 2:    * Bobbye Charleston, MD - Primary for TVT   ANESTHESIA:   general  EBL:  Total I/O In: 2500 [I.V.:2500] Out: 340 [Urine:240; Blood:100]   LOCAL MEDICATIONS USED:  Gelfoam for TVT  SPECIMEN:  No Specimen  DISPOSITION OF SPECIMEN:  N/A  COUNTS:  YES  TOURNIQUET:  * No tourniquets in log *  DICTATION: .Note written in EPIC  PLAN OF CARE: Admit for overnight observation  PATIENT DISPOSITION:  PACU - hemodynamically stable.   Delay start of Pharmacological VTE agent (>24hrs) due to surgical blood loss or risk of bleeding: not applicable  Meds:  gelfoam  Findings: cystocele to +1, 7 week size uterus and normal ovaries.  Prolapse to the introitus.  Technique: After Dr. Carlis Abbott finished the vaginal hysterectomy, she entered anterior vaginal mucosa was then entered into in the midline with the help of [name]'s after being injected with 0.5% marcaine with epi with the scalpel. The vaginal mucosa was then reflected off of the vesicouterine fascia with careful sharp dissection with the Metzenbaums until the pelvic floor could feel be felt underneath the pubic bone. I then made 2 small stab incisions 2 cm either side of the midline just above the pubic bone. The abdominal needles were placed carefully through the pelvic floor and out the vagina. The R needle needed to be replaced to make sure it did not go through the vagina and may have punctured the bladder on the first try. The Foley was removed and the cystoscope placed inside the bladder;  there were no needles in the bladder; however there was a small puncture hole evident in the top of the bladder dome.  There was good spillage of indigo carmine from the bilateral ureteral orifices and the trigone and urethra were clear.  The foley was replaced. The abdominal needles were attached the vaginal needles and the tape pulled through and cut into place. A [name] was used to ensure that the there was no tension placed on the tape. Once I was satisfied that the sling was in the correct place and at no tension, a piece of gelfoam was placed in the angles of where the tape went though the pelvic floor.  At this point, Dr. Carlis Abbott took over the case and finished the anterior repair.   The pt should have the catheter for a full week before doing her voiding trial.  Terren Haberle A

## 2015-09-13 NOTE — Transfer of Care (Signed)
Immediate Anesthesia Transfer of Care Note  Patient: Kelly Calhoun  Procedure(s) Performed: Procedure(s): HYSTERECTOMY VAGINAL (N/A) BILATERAL SALPINGECTOMY (Bilateral) CYSTOSCOPY (N/A) TRANSVAGINAL TAPE (TVT) PROCEDURE (N/A) ANTERIOR REPAIR (CYSTOCELE) (N/A)  Patient Location: PACU  Anesthesia Type:General  Level of Consciousness: awake, alert  and oriented  Airway & Oxygen Therapy: Patient Spontanous Breathing and Patient connected to nasal cannula oxygen  Post-op Assessment: Report given to RN and Post -op Vital signs reviewed and stable  Post vital signs: Reviewed and stable  Last Vitals:  Vitals:   09/13/15 0612  BP: (!) 122/93  Pulse: 71  Resp: 18  Temp: 36.6 C    Last Pain:  Vitals:   09/13/15 0612  TempSrc: Oral      Patients Stated Pain Goal: 4 ([id number])  Complications: No apparent anesthesia complications

## 2015-09-13 NOTE — Brief Op Note (Signed)
09/13/2015  10:16 AM  PATIENT:  Kelly Calhoun  45 y.o. female  PRE-OPERATIVE DIAGNOSIS:  STRESS URINARY INCONTINENCE  UTERINE PROLAPSE, CYSTOCELE  POST-OPERATIVE DIAGNOSIS:  Same  PROCEDURE:  Procedure(s): HYSTERECTOMY VAGINAL (N/A) BILATERAL SALPINGECTOMY (Bilateral) CYSTOSCOPY (N/A) TRANSVAGINAL TAPE (TVT) PROCEDURE (N/A) ANTERIOR REPAIR (CYSTOCELE) (N/A)  SURGEON:  Surgeon(s) and Role: Panel 1:    * Jerelyn Charles, MD - Primary  Panel 2:    * Bobbye Charleston, MD - Primary  ANESTHESIA:   general  EBL:  Total I/O In: 2500 [I.V.:2500] Out: 340 [Urine:240; Blood:100]  BLOOD ADMINISTERED:none  DRAINS: none   LOCAL MEDICATIONS USED:  NONE  SPECIMEN:  Source of Specimen:  uterus, cervix, bilateral fallopian tubes  DISPOSITION OF SPECIMEN:  PATHOLOGY  COUNTS:  YES  TOURNIQUET:  * No tourniquets in log *  DICTATION: .Note written in EPIC  PLAN OF CARE: Admit for overnight observation  PATIENT DISPOSITION:  PACU - hemodynamically stable.   Delay start of Pharmacological VTE agent (>24hrs) due to surgical blood loss or risk of bleeding: yes

## 2015-09-13 NOTE — Op Note (Signed)
Pre-Operative Diagnosis: Uterine and anterior vaginal wall prolapse, stress urinary incontinence  Postoperative Diagnosis: same  Procedure: Total vaginal hysterectomy, bilateral salpingectomy, anterior repair, TVT sling (by Dr. Philis Pique) and cystoscopy  Surgeon:Dema Timmons Carlis Abbott, MD  Assistant: Bobbye Charleston, MD--Primary for TVT  Anesthesia: General endotracheal anesthesia and 17 cc of dilute pitressin  Operative Findings: Small 7 week size anteverted uterus.  Right ovary and tube appeared normal.  Paratubal cyst on left ovary.  Simple cyst on left ovary.  Small portion of left fallopian tube containing the filshie clip left in situ.   Complications:  Puncture of bladder dome during TVT (please see separate operative note)  Specimen: uterus (75g), cervix, bilateral tubes  EBL: 100 cc  Description of the Procedure:  The patient was taken to the operating room, where general endotracheal anesthesia was obtained without difficulty. She was placed in the dorsal lithotomy position in [place] and exam under anesthesia was performed, and a small mobile uterus was appreciated. The patient was prepped and draped in the normal sterile fashion. The bladder was emptied with a red rubber catheter and 60 cc of Similac was placed in the bladder.    The cervix was grasped with two single tooth tenacula and injected circumferentially with dilute pitressin.  Bovie cautery was used to circumferentially incise the cervicovaginal junction. The posterior cul-de-sac was entered into with Mayo scissors. The long billed duckbill retractor was then placed into the peritoneal cavity.  The the uterosacrals were grasped with a pair heney clamps on either side and secured with a Heaney stitch of 0 Vicryl. The bladder was carefully dissected off of the cervix with sharp dissection with the Metzenbaums. Curved Heaneys were obtained to sequentially clamp, cut, and suture ligate working up the cardinal ligaments until  entry could be gained anteriorly into the peritoneum.  At the level of the cornua, Heaneys were placed bilaterally around the entire pedicle and the uterus was able to be amputated.  The pedicles were secured with a free tie of 0 vicryl.  The tubes on each side were successively brought down with a babcock clamp. The pedicles were each secured with a free tie of 0 Vicryl followed by a stitch of 0 Vicryl bilaterally.  A small portion of the left fallopian tube that contained the filshie clip from prior tubal ligation was unable to easily be removed without placing undue tension on the IP ligament.  Therefore, this small portion of the tube was left in situ.  The ovaries were examined and appeared normal bilaterally.  The left ovary was noted to have a small simple cyst.  The inferior aspect of the cuff was closed with running locked 0 Vicryl suture.    Once hemostasis was achieved the peritoneum was closed in a pursestring fashion including the bilateral uterosacrals and posteriorly in and out of the vagina and a partial Halbans culdoplasty.  The stitch was pulled taut closing the peritoneum and pulling the uterosacrals together through the modified Hall bands and through the vagina. The Foley was placed at this point.  The anterior vaginal mucosa was then entered into in the midline with the help of [name]'s after being injected with dilute pitressin with the scalpel. The vaginal mucosa was then reflected off of the endopelvic fascia with careful sharp dissection with the Metzenbaums until the pelvic floor could feel be felt underneath the pubic bone.   At this time, the TVT and cystoscopy were performed by Dr. Philis Pique.  Please see separate operative note for details.  Once the TVT was confirmed to be in the correct place and at no tension, the foley catheter was replaced.  The anterior repair was done with 2 mattress stitches of 0 Vicryl. Hemostasis was achieved with gelfoam and pressure. The vaginal mucosa  was trimmed and closed with a running locked stitch of 2-0 Vicryl. The superior portion of the cuff was then closed with an additional figure-of-eight stitches of 0 Vicryl. A vaginal pack was placed inside the vagina with Estrace cream and the patient tolerated the procedure was returned to recovery in stable condition.  The skin incisions from the TVT were closed with Dermabond. The patient tolerated all portions of procedure well. Sponge, lap, and needle count were correct x2.

## 2015-09-13 NOTE — Op Note (Addendum)
09/13/2015  10:07 AM  PATIENT:  Kelly Calhoun  45 y.o. female  PRE-OPERATIVE DIAGNOSIS:  STRESS URINARY INCONTINENCE  UTERINE PROLAPSE, CYSTOCELE  POST-OPERATIVE DIAGNOSIS: same  PROCEDURE:  Procedure(s): HYSTERECTOMY VAGINAL (N/A) BILATERAL SALPINGECTOMY (Bilateral) CYSTOSCOPY (N/A) TRANSVAGINAL TAPE (TVT) PROCEDURE (N/A) ANTERIOR REPAIR (CYSTOCELE) (N/A)  SURGEON:  Surgeon(s) and Role: Panel 1:    * Jerelyn Charles, MD - Primary  Panel 2:    * Bobbye Charleston, MD - Primary for TVT   ANESTHESIA:   general  EBL:  Total I/O In: 2500 [I.V.:2500] Out: 340 [Urine:240; Blood:100]   LOCAL MEDICATIONS USED:  Gelfoam for TVT  SPECIMEN:  No Specimen  DISPOSITION OF SPECIMEN:  N/A  COUNTS:  YES  TOURNIQUET:  * No tourniquets in log *  DICTATION: .Note written in EPIC  PLAN OF CARE: Admit for overnight observation  PATIENT DISPOSITION:  PACU - hemodynamically stable.   Delay start of Pharmacological VTE agent (>24hrs) due to surgical blood loss or risk of bleeding: not applicable  Meds:  For TVT-Gel foam and estrace.  Findings: hypermobile uretrhal; rest of findings per Dr. Carlis Abbott  Complications: puncture of bladder dome.  Technique: After Dr. Carlis Abbott finished the vaginal hysterectomy, she entered anterior vaginal mucosa was then entered into in the midline with the help of [name]'s after being injected with 0.5% marcaine with epi with the scalpel. The vaginal mucosa was then reflected off of the vesicouterine fascia with careful sharp dissection with the Metzenbaums until the pelvic floor could feel be felt underneath the pubic bone. I then made 2 small stab incisions 2 cm either side of the midline just above the pubic bone. The abdominal needles were placed carefully through the pelvic floor and out the vagina. The R needle needed to be replaced to make sure it did not go through the vagina and may have punctured the bladder on the first try. The Foley was removed and the  cystoscope placed inside the bladder; there were no needles in the bladder; however there was a small puncture hole evident in the top of the bladder dome.  There was good spillage of indigo carmine from the bilateral ureteral orifices and the trigone and urethra were clear.  The foley was replaced. The abdominal needles were attached the vaginal needles and the tape pulled through and cut into place. A [name] was used to ensure that the there was no tension placed on the tape. Once I was satisfied that the sling was in the correct place and at no tension, a piece of gelfoam was placed in the angles of where the tape went though the pelvic floor.  At this point, Dr. Carlis Abbott took over the case and finished the anterior repair.   The pt should have the catheter for a full week before doing her voiding trial.  [name] A

## 2015-09-13 NOTE — Anesthesia Procedure Notes (Signed)
Procedure Name: Intubation Date/Time: 09/13/2015 7:37 AM Performed by: Riki Sheer Pre-anesthesia Checklist: Patient identified, Emergency Drugs available, Suction available, Patient being monitored and Timeout performed Patient Re-evaluated:Patient Re-evaluated prior to inductionOxygen Delivery Method: Circle system utilized Preoxygenation: Pre-oxygenation with 100% oxygen Intubation Type: IV induction Ventilation: Mask ventilation without difficulty Laryngoscope Size: Miller and 2 Grade View: Grade I Tube type: Oral Tube size: 7.0 mm Number of attempts: 1 Airway Equipment and Method: Stylet Placement Confirmation: ETT inserted through vocal cords under direct vision,  positive ETCO2,  CO2 detector and breath sounds checked- equal and bilateral Secured at: 21 cm Tube secured with: Tape Dental Injury: Teeth and Oropharynx as per pre-operative assessment

## 2015-09-13 NOTE — H&P (Signed)
45 y.o. G3P2 presents for [facility] / anterior repair / TVT for uterine prolapse and SUI.  She presented with symptoms of pressure and bulge, worse with exercise.  +SUI with any activity.  Failed pessary.  Exercises daily and prolapse interfering with quality of life.   Past Medical History:  Diagnosis Date  . Complication of anesthesia    believes hair fell out after anesthesia, patient request epidural for surgery  . Crohn disease ([place])   . GERD (gastroesophageal reflux disease)     Past Surgical History:  Procedure Laterality Date  . BREAST ENHANCEMENT SURGERY    . LAPAROSCOPIC TUBAL LIGATION N/A 04/07/2014   Procedure: LAPAROSCOPIC TUBAL LIGATION;  Surgeon: Jerelyn Charles, MD;  Location: [facility];  Service: Gynecology;  Laterality: N/A;  . NASAL SEPTUM SURGERY    . [place] N/A 04/07/2014   Procedure: NOVASURE ABLATION;  Surgeon: Jerelyn Charles, MD;  Location: [facility];  Service: Gynecology;  Laterality: N/A;    OB History  No data available    Social History   Social History  . Marital status: Married    Spouse name: N/A  . Number of children: N/A  . Years of education: N/A   Occupational History  . Not on file.   Social History Main Topics  . Smoking status: Never Smoker  . Smokeless tobacco: Never Used  . Alcohol use Yes  . Drug use: No  . Sexual activity: Yes    Birth control/ protection: Surgical    Review of patient's allergies indicates no known allergies.    Vitals:   09/13/15 0612  BP: (!) 122/93  Pulse: 71  Resp: 18  Temp: 97.9 F (36.6 C)     General:  NAD Lungs: CTAB Cardiac: RRR Abdomen:  Soft Pelvic exam: uterus 8 weeks size, mobile Ex:  no edema  Pelvic US: normal size uterus with 7 mm EM. 1.6 cm atypical area near endometrium that is cystic in appearance. Possible uterine fibroid or artifact from ablation.2.2 cm complex mass in L ovary with 1 cm mildly complex cyst in same ovary-- likely hemorrhagic cysts.  A/P   45 y.o. presents for [facility] /  bilateral salpingectomies / anterior repair / TVT / cystoscopy.   Discussed in detail risk, benefits, alternatives to procedures.  She understands the risks to include infection, bleeding, damage to surrounding structures (including but not limited to bowel, bladder, ureters, ovaries, nerves, vessels), risk of vte, need for blood transfusion, possible conversion to laparotomy, recurrence of prolapse or SUI, urinary retention.  Plan for ovarian conservation--removal of one ovary only if grossly abnormal. She understands and elects to proceed.  Consent signed.    UPT negative in holding.    [place]

## 2015-09-13 NOTE — Anesthesia Preprocedure Evaluation (Addendum)
Anesthesia Evaluation  Patient identified by MRN, date of birth, ID band Patient awake    Reviewed: Allergy & Precautions, NPO status , Patient's Chart, lab work & pertinent test results  Airway Mallampati: I  TM Distance: >3 FB Neck ROM: Full    Dental  (+) Teeth Intact   Pulmonary neg pulmonary ROS,    breath sounds clear to auscultation       Cardiovascular negative cardio ROS   Rhythm:Regular Rate:Normal     Neuro/Psych negative neurological ROS  negative psych ROS   GI/Hepatic Neg liver ROS, GERD  ,  Endo/Other  negative endocrine ROS  Renal/GU negative Renal ROS  negative genitourinary   Musculoskeletal negative musculoskeletal ROS (+)   Abdominal   Peds negative pediatric ROS (+)  Hematology negative hematology ROS (+)   Anesthesia Other Findings   Reproductive/Obstetrics negative OB ROS                            Lab Results  Component Value Date   WBC 7.7 08/30/2015   HGB 13.5 08/30/2015   HCT 38.8 08/30/2015   MCV 91.3 08/30/2015   PLT 264 08/30/2015   No results found for: CREATININE, BUN, NA, K, CL, CO2 No results found for: INR, PROTIME   Anesthesia Physical Anesthesia Plan  ASA: II  Anesthesia Plan: General   Post-op Pain Management:    Induction: Intravenous  Airway Management Planned: Oral ETT  Additional Equipment:   Intra-op Plan:   Post-operative Plan: Extubation in OR  Informed Consent: I have reviewed the patients History and Physical, chart, labs and discussed the procedure including the risks, benefits and alternatives for the proposed anesthesia with the patient or authorized representative who has indicated his/her understanding and acceptance.   Dental advisory given  Plan Discussed with: CRNA  Anesthesia Plan Comments:         Anesthesia Quick Evaluation

## 2015-09-14 ENCOUNTER — Encounter (HOSPITAL_COMMUNITY): Payer: Self-pay | Admitting: Obstetrics

## 2015-09-14 DIAGNOSIS — N888 Other specified noninflammatory disorders of cervix uteri: Secondary | ICD-10-CM | POA: Diagnosis not present

## 2015-09-14 DIAGNOSIS — N814 Uterovaginal prolapse, unspecified: Secondary | ICD-10-CM | POA: Diagnosis not present

## 2015-09-14 DIAGNOSIS — N393 Stress incontinence (female) (male): Secondary | ICD-10-CM | POA: Diagnosis not present

## 2015-09-14 DIAGNOSIS — K219 Gastro-esophageal reflux disease without esophagitis: Secondary | ICD-10-CM | POA: Diagnosis not present

## 2015-09-14 LAB — CBC
HCT: 34.8 % — ABNORMAL LOW (ref 36.0–46.0)
Hemoglobin: 11.7 g/dL — ABNORMAL LOW (ref 12.0–15.0)
MCH: 31 pg (ref 26.0–34.0)
MCHC: 33.6 g/dL (ref 30.0–36.0)
MCV: 92.1 fL (ref 78.0–100.0)
PLATELETS: 262 10*3/uL (ref 150–400)
RBC: 3.78 MIL/uL — AB (ref 3.87–5.11)
RDW: 13.7 % (ref 11.5–15.5)
WBC: 9.3 10*3/uL (ref 4.0–10.5)

## 2015-09-14 LAB — BASIC METABOLIC PANEL
Anion gap: 7 (ref 5–15)
BUN: 9 mg/dL (ref 6–20)
CALCIUM: 8.7 mg/dL — AB (ref 8.9–10.3)
CO2: 25 mmol/L (ref 22–32)
CREATININE: 0.72 mg/dL (ref 0.44–1.00)
Chloride: 104 mmol/L (ref 101–111)
Glucose, Bld: 99 mg/dL (ref 65–99)
Potassium: 3.8 mmol/L (ref 3.5–5.1)
SODIUM: 136 mmol/L (ref 135–145)

## 2015-09-14 MED ORDER — IBUPROFEN 600 MG PO TABS: 600.0000 mg | ORAL_TABLET | Freq: Four times a day (QID) | ORAL | 0 refills | Status: DC | PRN

## 2015-09-14 MED ORDER — OXYCODONE HCL 5 MG PO TABS: 5.0000 mg | ORAL_TABLET | ORAL | 0 refills | Status: DC | PRN

## 2015-09-14 MED ORDER — NITROFURANTOIN MONOHYD MACRO 100 MG PO CAPS: 100.0000 mg | ORAL_CAPSULE | Freq: Every day | ORAL | 0 refills | Status: DC

## 2015-09-14 NOTE — Progress Notes (Signed)
Patient is doing well.  She is ambulating, tolerating PO without nausea/vomiting.  Pain control is good.   Vitals:   09/13/15 1750 09/13/15 2155 09/14/15 0119 09/14/15 0524  BP: 119/73 120/80 114/68 137/82  Pulse: 66 61 72 84  Resp: 16 16 16 18   Temp: 98.3 F (36.8 C) 98.6 F (37 C) 98.6 F (37 C) 98.2 F (36.8 C)  TempSrc: Oral Oral Tympanic Oral  SpO2:  99% 98% 98%  Weight:      Height:        NAD RRR CTAB Abd: soft, appropriately tender to palpation, no rebound or guarding Perineum: scant heme on vaginal packing after removal Ext: no edema  Lab Results  Component Value Date   WBC 9.3 09/14/2015   HGB 11.7 (L) 09/14/2015   HCT 34.8 (L) 09/14/2015   MCV 92.1 09/14/2015   PLT 262 09/14/2015      A/P 45 y.o. POD#1 s/p TVH / bilateral salpingectomy / anterior repair / TVT sling complicated by bladder puncture during TVT.  Doing well Vaginal packing removed.   Will d/c w foley catheter.  Plan return visit to office in one week for voiding trial/removal.  UTI following ablation last year--will d/c w daily macrobid for ppx given foley catheter.  D/c today [place]

## 2015-09-14 NOTE — Discharge Summary (Signed)
    Discharge Summary     Patient Name: Kelly Calhoun DOB: August 23, 1970 MRN: [id number]  Date of admission: 09/13/2015 Date of discharge: 09/14/2015  Admitting diagnosis: SUI, UTERINE PROLAPSE, CYSTOCELE  Patient admitted for observation following tvh/anterior repair/tvt sling and cystoscopy.  TVT sling complicated by bladder puncture.    On POD#1, pt doing well--ambulating, tolerating diet without nausea/vomiting.  Vaginal packing removed with minimal heme.  Pain controlled with motrin alone.  Urine output excellent and hgb stable.  She is discharged to home with foley catheter d/t bladder injury         Physical exam  Vitals:   09/13/15 2155 09/14/15 0119 09/14/15 0524 09/14/15 1000  BP: 120/80 114/68 137/82 124/81  Pulse: 61 72 84 65  Resp: 16 16 18 18   Temp: 98.6 F (37 C) 98.6 F (37 C) 98.2 F (36.8 C) 98.6 F (37 C)  TempSrc: Oral Tympanic Oral Oral  SpO2: 99% 98% 98% 100%  Weight:      Height:       General: alert and no distress Incision: Healing well with no significant drainage, No significant erythema DVT Evaluation: No evidence of DVT seen on physical exam. Labs: Lab Results  Component Value Date   WBC 9.3 09/14/2015   HGB 11.7 (L) 09/14/2015   HCT 34.8 (L) 09/14/2015   MCV 92.1 09/14/2015   PLT 262 09/14/2015   CMP Latest Ref Rng & Units 09/14/2015  Glucose 65 - 99 mg/dL 99  BUN 6 - 20 mg/dL 9  Creatinine 0.44 - 1.00 mg/dL 0.72  Sodium 135 - 145 mmol/L 136  Potassium 3.5 - 5.1 mmol/L 3.8  Chloride 101 - 111 mmol/L 104  CO2 22 - 32 mmol/L 25  Calcium 8.9 - 10.3 mg/dL 8.7(L)    Discharge instruction:pelvic rest x 6 weeks, no lifting > 10 lbs  After visit meds:    Medication List    STOP taking these medications   oxyCODONE-acetaminophen 5-325 MG tablet Commonly known as:  PERCOCET/ROXICET     TAKE these medications   DELZICOL 400 MG Cpdr DR capsule Generic drug:  Mesalamine Take 800 mg by mouth 2 (two) times daily.   ibuprofen 600 MG  tablet Commonly known as:  ADVIL,MOTRIN Take 1 tablet (600 mg total) by mouth every 6 (six) hours as needed.   Melatonin 3 MG Tabs Take 6 tablets by mouth at bedtime.   multivitamin with minerals Tabs tablet Take 2 tablets by mouth daily.   nitrofurantoin (macrocrystal-monohydrate) 100 MG capsule Commonly known as:  MACROBID Take 1 capsule (100 mg total) by mouth at bedtime.   oxyCODONE 5 MG immediate release tablet Commonly known as:  Oxy IR/ROXICODONE Take 1 tablet (5 mg total) by mouth every 4 (four) hours as needed for severe pain.   RETIN-A 0.025 % cream Generic drug:  tretinoin Apply 1 application topically 3 (three) times a week.   VITAMIN C PO Take 240 mg by mouth daily.   Vitamin D 2000 units Caps Take 4,000 Units by mouth daily.       Diet: routine diet  Activity: Advance as tolerated. Pelvic rest for 6 weeks.   Outpatient follow up: 1 week for voiding trial   09/14/2015 [facility] GEFFEL Carlis Abbott, MD

## 2015-09-14 NOTE — Progress Notes (Signed)
Pt discharged to home with husband and daughter.  Condition stable.  Discharge instructions reviewed including care of Foley catheter and changing between leg bag and large drainage bag.  Pt performed return demonstration of emptying large drainage bag and attaching and removing catheter from leg strap.  Pt home with leg bag and extra leg straps.  Pt ambulated to car with C. Riley,NT. No other equipment ordered for home at discharge.

## 2015-09-14 NOTE — Plan of Care (Signed)
Problem: Urinary Elimination: Goal: Ability to reestablish a normal urinary elimination pattern will improve Outcome: Adequate for Discharge Pt home with Foley catheter d/t bladder injury during surgery.

## 2015-09-21 ENCOUNTER — Other Ambulatory Visit: Payer: Self-pay | Admitting: Obstetrics

## 2015-09-21 DIAGNOSIS — R300 Dysuria: Secondary | ICD-10-CM | POA: Diagnosis not present

## 2015-09-21 DIAGNOSIS — Z6823 Body mass index (BMI) 23.0-23.9, adult: Secondary | ICD-10-CM | POA: Diagnosis not present

## 2015-09-21 DIAGNOSIS — N390 Urinary tract infection, site not specified: Secondary | ICD-10-CM | POA: Diagnosis not present

## 2015-10-01 DIAGNOSIS — K5010 Crohn's disease of large intestine without complications: Secondary | ICD-10-CM | POA: Diagnosis not present

## 2015-10-01 DIAGNOSIS — K219 Gastro-esophageal reflux disease without esophagitis: Secondary | ICD-10-CM | POA: Diagnosis not present

## 2016-01-02 DIAGNOSIS — L814 Other melanin hyperpigmentation: Secondary | ICD-10-CM | POA: Diagnosis not present

## 2016-01-02 DIAGNOSIS — D2271 Melanocytic nevi of right lower limb, including hip: Secondary | ICD-10-CM | POA: Diagnosis not present

## 2016-01-02 DIAGNOSIS — D225 Melanocytic nevi of trunk: Secondary | ICD-10-CM | POA: Diagnosis not present

## 2016-01-02 DIAGNOSIS — D2272 Melanocytic nevi of left lower limb, including hip: Secondary | ICD-10-CM | POA: Diagnosis not present

## 2016-03-13 DIAGNOSIS — Z1231 Encounter for screening mammogram for malignant neoplasm of breast: Secondary | ICD-10-CM | POA: Diagnosis not present

## 2016-03-13 DIAGNOSIS — Z6822 Body mass index (BMI) 22.0-22.9, adult: Secondary | ICD-10-CM | POA: Diagnosis not present

## 2016-04-17 DIAGNOSIS — E781 Pure hyperglyceridemia: Secondary | ICD-10-CM | POA: Diagnosis not present

## 2016-04-17 DIAGNOSIS — Z0000 Encounter for general adult medical examination without abnormal findings: Secondary | ICD-10-CM | POA: Diagnosis not present

## 2016-04-24 DIAGNOSIS — K5090 Crohn's disease, unspecified, without complications: Secondary | ICD-10-CM | POA: Diagnosis not present

## 2016-04-24 DIAGNOSIS — G4709 Other insomnia: Secondary | ICD-10-CM | POA: Diagnosis not present

## 2016-04-24 DIAGNOSIS — Z1389 Encounter for screening for other disorder: Secondary | ICD-10-CM | POA: Diagnosis not present

## 2016-04-24 DIAGNOSIS — Z0000 Encounter for general adult medical examination without abnormal findings: Secondary | ICD-10-CM | POA: Diagnosis not present

## 2016-04-24 DIAGNOSIS — Z23 Encounter for immunization: Secondary | ICD-10-CM | POA: Diagnosis not present

## 2016-04-24 DIAGNOSIS — E781 Pure hyperglyceridemia: Secondary | ICD-10-CM | POA: Diagnosis not present

## 2016-05-18 DIAGNOSIS — J00 Acute nasopharyngitis [common cold]: Secondary | ICD-10-CM | POA: Diagnosis not present

## 2016-05-18 DIAGNOSIS — R05 Cough: Secondary | ICD-10-CM | POA: Diagnosis not present

## 2016-05-18 DIAGNOSIS — J209 Acute bronchitis, unspecified: Secondary | ICD-10-CM | POA: Diagnosis not present

## 2016-05-20 DIAGNOSIS — R05 Cough: Secondary | ICD-10-CM | POA: Diagnosis not present

## 2016-05-20 DIAGNOSIS — J111 Influenza due to unidentified influenza virus with other respiratory manifestations: Secondary | ICD-10-CM | POA: Diagnosis not present

## 2016-10-09 DIAGNOSIS — K5010 Crohn's disease of large intestine without complications: Secondary | ICD-10-CM | POA: Diagnosis not present

## 2016-12-15 DIAGNOSIS — Z23 Encounter for immunization: Secondary | ICD-10-CM | POA: Diagnosis not present

## 2016-12-26 DIAGNOSIS — I8312 Varicose veins of left lower extremity with inflammation: Secondary | ICD-10-CM | POA: Diagnosis not present

## 2017-01-08 DIAGNOSIS — C4441 Basal cell carcinoma of skin of scalp and neck: Secondary | ICD-10-CM | POA: Diagnosis not present

## 2017-01-08 DIAGNOSIS — D0472 Carcinoma in situ of skin of left lower limb, including hip: Secondary | ICD-10-CM | POA: Diagnosis not present

## 2017-01-08 DIAGNOSIS — D225 Melanocytic nevi of trunk: Secondary | ICD-10-CM | POA: Diagnosis not present

## 2017-01-08 DIAGNOSIS — D0471 Carcinoma in situ of skin of right lower limb, including hip: Secondary | ICD-10-CM | POA: Diagnosis not present

## 2017-01-08 DIAGNOSIS — L821 Other seborrheic keratosis: Secondary | ICD-10-CM | POA: Diagnosis not present

## 2017-01-08 DIAGNOSIS — L814 Other melanin hyperpigmentation: Secondary | ICD-10-CM | POA: Diagnosis not present

## 2017-01-14 DIAGNOSIS — I8312 Varicose veins of left lower extremity with inflammation: Secondary | ICD-10-CM | POA: Diagnosis not present

## 2017-02-11 DIAGNOSIS — I8312 Varicose veins of left lower extremity with inflammation: Secondary | ICD-10-CM | POA: Diagnosis not present

## 2017-03-02 DIAGNOSIS — C4441 Basal cell carcinoma of skin of scalp and neck: Secondary | ICD-10-CM | POA: Diagnosis not present

## 2017-03-02 DIAGNOSIS — C44719 Basal cell carcinoma of skin of left lower limb, including hip: Secondary | ICD-10-CM | POA: Diagnosis not present

## 2017-03-02 DIAGNOSIS — C44319 Basal cell carcinoma of skin of other parts of face: Secondary | ICD-10-CM | POA: Diagnosis not present

## 2017-04-02 DIAGNOSIS — Z1231 Encounter for screening mammogram for malignant neoplasm of breast: Secondary | ICD-10-CM | POA: Diagnosis not present

## 2017-04-02 DIAGNOSIS — Z01419 Encounter for gynecological examination (general) (routine) without abnormal findings: Secondary | ICD-10-CM | POA: Diagnosis not present

## 2017-04-02 DIAGNOSIS — Z6820 Body mass index (BMI) 20.0-20.9, adult: Secondary | ICD-10-CM | POA: Diagnosis not present

## 2017-07-30 DIAGNOSIS — Z6820 Body mass index (BMI) 20.0-20.9, adult: Secondary | ICD-10-CM | POA: Diagnosis not present

## 2017-07-30 DIAGNOSIS — N951 Menopausal and female climacteric states: Secondary | ICD-10-CM | POA: Diagnosis not present

## 2017-08-19 DIAGNOSIS — M25511 Pain in right shoulder: Secondary | ICD-10-CM | POA: Diagnosis not present

## 2017-08-27 DIAGNOSIS — M25511 Pain in right shoulder: Secondary | ICD-10-CM | POA: Diagnosis not present

## 2017-09-10 DIAGNOSIS — M25511 Pain in right shoulder: Secondary | ICD-10-CM | POA: Diagnosis not present

## 2017-09-17 DIAGNOSIS — M25511 Pain in right shoulder: Secondary | ICD-10-CM | POA: Diagnosis not present

## 2017-11-09 DIAGNOSIS — F4321 Adjustment disorder with depressed mood: Secondary | ICD-10-CM | POA: Diagnosis not present

## 2017-11-09 DIAGNOSIS — F1010 Alcohol abuse, uncomplicated: Secondary | ICD-10-CM | POA: Diagnosis not present

## 2017-11-09 DIAGNOSIS — Z681 Body mass index (BMI) 19 or less, adult: Secondary | ICD-10-CM | POA: Diagnosis not present

## 2017-11-09 DIAGNOSIS — F430 Acute stress reaction: Secondary | ICD-10-CM | POA: Diagnosis not present

## 2017-11-11 DIAGNOSIS — K5010 Crohn's disease of large intestine without complications: Secondary | ICD-10-CM | POA: Diagnosis not present

## 2017-11-13 DIAGNOSIS — Z113 Encounter for screening for infections with a predominantly sexual mode of transmission: Secondary | ICD-10-CM | POA: Diagnosis not present

## 2017-11-13 DIAGNOSIS — N760 Acute vaginitis: Secondary | ICD-10-CM | POA: Diagnosis not present

## 2017-11-13 DIAGNOSIS — Z681 Body mass index (BMI) 19 or less, adult: Secondary | ICD-10-CM | POA: Diagnosis not present

## 2017-11-19 DIAGNOSIS — R3989 Other symptoms and signs involving the genitourinary system: Secondary | ICD-10-CM | POA: Diagnosis not present

## 2017-12-09 DIAGNOSIS — F4321 Adjustment disorder with depressed mood: Secondary | ICD-10-CM | POA: Diagnosis not present

## 2017-12-09 DIAGNOSIS — Z23 Encounter for immunization: Secondary | ICD-10-CM | POA: Diagnosis not present

## 2017-12-09 DIAGNOSIS — F1010 Alcohol abuse, uncomplicated: Secondary | ICD-10-CM | POA: Diagnosis not present

## 2017-12-09 DIAGNOSIS — F430 Acute stress reaction: Secondary | ICD-10-CM | POA: Diagnosis not present

## 2018-01-11 DIAGNOSIS — D2261 Melanocytic nevi of right upper limb, including shoulder: Secondary | ICD-10-CM | POA: Diagnosis not present

## 2018-01-11 DIAGNOSIS — Z85828 Personal history of other malignant neoplasm of skin: Secondary | ICD-10-CM | POA: Diagnosis not present

## 2018-01-11 DIAGNOSIS — D2262 Melanocytic nevi of left upper limb, including shoulder: Secondary | ICD-10-CM | POA: Diagnosis not present

## 2018-01-11 DIAGNOSIS — B078 Other viral warts: Secondary | ICD-10-CM | POA: Diagnosis not present

## 2018-01-11 DIAGNOSIS — D225 Melanocytic nevi of trunk: Secondary | ICD-10-CM | POA: Diagnosis not present

## 2018-01-21 DIAGNOSIS — K635 Polyp of colon: Secondary | ICD-10-CM | POA: Diagnosis not present

## 2018-01-21 DIAGNOSIS — K529 Noninfective gastroenteritis and colitis, unspecified: Secondary | ICD-10-CM | POA: Diagnosis not present

## 2018-01-21 DIAGNOSIS — K5289 Other specified noninfective gastroenteritis and colitis: Secondary | ICD-10-CM | POA: Diagnosis not present

## 2018-01-21 DIAGNOSIS — K6389 Other specified diseases of intestine: Secondary | ICD-10-CM | POA: Diagnosis not present

## 2018-01-21 DIAGNOSIS — D126 Benign neoplasm of colon, unspecified: Secondary | ICD-10-CM | POA: Diagnosis not present

## 2018-01-21 DIAGNOSIS — K5010 Crohn's disease of large intestine without complications: Secondary | ICD-10-CM | POA: Diagnosis not present

## 2018-01-21 DIAGNOSIS — K633 Ulcer of intestine: Secondary | ICD-10-CM | POA: Diagnosis not present

## 2018-01-21 DIAGNOSIS — K640 First degree hemorrhoids: Secondary | ICD-10-CM | POA: Diagnosis not present

## 2018-01-26 DIAGNOSIS — D126 Benign neoplasm of colon, unspecified: Secondary | ICD-10-CM | POA: Diagnosis not present

## 2018-01-26 DIAGNOSIS — K5289 Other specified noninfective gastroenteritis and colitis: Secondary | ICD-10-CM | POA: Diagnosis not present

## 2018-01-26 DIAGNOSIS — K635 Polyp of colon: Secondary | ICD-10-CM | POA: Diagnosis not present

## 2018-02-11 DIAGNOSIS — B078 Other viral warts: Secondary | ICD-10-CM | POA: Diagnosis not present

## 2018-03-11 DIAGNOSIS — J328 Other chronic sinusitis: Secondary | ICD-10-CM | POA: Diagnosis not present

## 2018-03-11 DIAGNOSIS — M26622 Arthralgia of left temporomandibular joint: Secondary | ICD-10-CM | POA: Diagnosis not present

## 2018-03-22 ENCOUNTER — Ambulatory Visit
Admission: RE | Admit: 2018-03-22 | Discharge: 2018-03-22 | Disposition: A | Discharge status: Home / Self Care | Payer: BLUE CROSS/BLUE SHIELD | Source: Ambulatory Visit | Attending: Internal Medicine | Admitting: Internal Medicine

## 2018-03-22 ENCOUNTER — Other Ambulatory Visit: Payer: Self-pay | Admitting: Internal Medicine

## 2018-03-22 DIAGNOSIS — J329 Chronic sinusitis, unspecified: Secondary | ICD-10-CM

## 2018-03-25 DIAGNOSIS — B078 Other viral warts: Secondary | ICD-10-CM | POA: Diagnosis not present

## 2018-04-29 DIAGNOSIS — M26629 Arthralgia of temporomandibular joint, unspecified side: Secondary | ICD-10-CM | POA: Diagnosis not present

## 2018-06-11 DIAGNOSIS — D509 Iron deficiency anemia, unspecified: Secondary | ICD-10-CM | POA: Diagnosis not present

## 2018-09-21 DIAGNOSIS — M542 Cervicalgia: Secondary | ICD-10-CM | POA: Diagnosis not present

## 2018-09-23 DIAGNOSIS — M542 Cervicalgia: Secondary | ICD-10-CM | POA: Diagnosis not present

## 2018-09-28 DIAGNOSIS — M542 Cervicalgia: Secondary | ICD-10-CM | POA: Diagnosis not present

## 2018-09-30 DIAGNOSIS — M542 Cervicalgia: Secondary | ICD-10-CM | POA: Diagnosis not present

## 2018-10-08 DIAGNOSIS — M542 Cervicalgia: Secondary | ICD-10-CM | POA: Diagnosis not present

## 2018-10-14 DIAGNOSIS — M542 Cervicalgia: Secondary | ICD-10-CM | POA: Diagnosis not present

## 2018-10-20 DIAGNOSIS — M542 Cervicalgia: Secondary | ICD-10-CM | POA: Diagnosis not present

## 2018-11-02 DIAGNOSIS — H9312 Tinnitus, left ear: Secondary | ICD-10-CM | POA: Diagnosis not present

## 2018-11-02 DIAGNOSIS — G253 Myoclonus: Secondary | ICD-10-CM | POA: Diagnosis not present

## 2018-11-09 DIAGNOSIS — G253 Myoclonus: Secondary | ICD-10-CM | POA: Diagnosis not present

## 2018-11-11 DIAGNOSIS — G253 Myoclonus: Secondary | ICD-10-CM | POA: Diagnosis not present

## 2018-12-13 DIAGNOSIS — Z0000 Encounter for general adult medical examination without abnormal findings: Secondary | ICD-10-CM | POA: Diagnosis not present

## 2018-12-13 DIAGNOSIS — E781 Pure hyperglyceridemia: Secondary | ICD-10-CM | POA: Diagnosis not present

## 2018-12-13 DIAGNOSIS — K5090 Crohn's disease, unspecified, without complications: Secondary | ICD-10-CM | POA: Diagnosis not present

## 2018-12-17 DIAGNOSIS — K5090 Crohn's disease, unspecified, without complications: Secondary | ICD-10-CM | POA: Diagnosis not present

## 2018-12-17 DIAGNOSIS — R7301 Impaired fasting glucose: Secondary | ICD-10-CM | POA: Diagnosis not present

## 2018-12-17 DIAGNOSIS — Z0000 Encounter for general adult medical examination without abnormal findings: Secondary | ICD-10-CM | POA: Diagnosis not present

## 2018-12-17 DIAGNOSIS — G253 Myoclonus: Secondary | ICD-10-CM | POA: Diagnosis not present

## 2018-12-17 DIAGNOSIS — E781 Pure hyperglyceridemia: Secondary | ICD-10-CM | POA: Diagnosis not present

## 2018-12-28 DIAGNOSIS — H5213 Myopia, bilateral: Secondary | ICD-10-CM | POA: Diagnosis not present

## 2019-01-17 DIAGNOSIS — D2262 Melanocytic nevi of left upper limb, including shoulder: Secondary | ICD-10-CM | POA: Diagnosis not present

## 2019-01-17 DIAGNOSIS — L821 Other seborrheic keratosis: Secondary | ICD-10-CM | POA: Diagnosis not present

## 2019-01-17 DIAGNOSIS — L814 Other melanin hyperpigmentation: Secondary | ICD-10-CM | POA: Diagnosis not present

## 2019-01-17 DIAGNOSIS — Z85828 Personal history of other malignant neoplasm of skin: Secondary | ICD-10-CM | POA: Diagnosis not present

## 2019-02-12 DIAGNOSIS — S93491A Sprain of other ligament of right ankle, initial encounter: Secondary | ICD-10-CM | POA: Diagnosis not present

## 2019-02-24 DIAGNOSIS — Z681 Body mass index (BMI) 19 or less, adult: Secondary | ICD-10-CM | POA: Diagnosis not present

## 2019-02-24 DIAGNOSIS — Z01419 Encounter for gynecological examination (general) (routine) without abnormal findings: Secondary | ICD-10-CM | POA: Diagnosis not present

## 2019-02-24 DIAGNOSIS — Z1231 Encounter for screening mammogram for malignant neoplasm of breast: Secondary | ICD-10-CM | POA: Diagnosis not present

## 2019-07-13 DIAGNOSIS — M9903 Segmental and somatic dysfunction of lumbar region: Secondary | ICD-10-CM | POA: Diagnosis not present

## 2019-07-13 DIAGNOSIS — I8312 Varicose veins of left lower extremity with inflammation: Secondary | ICD-10-CM | POA: Diagnosis not present

## 2019-07-13 DIAGNOSIS — M5411 Radiculopathy, occipito-atlanto-axial region: Secondary | ICD-10-CM | POA: Diagnosis not present

## 2019-07-13 DIAGNOSIS — M9901 Segmental and somatic dysfunction of cervical region: Secondary | ICD-10-CM | POA: Diagnosis not present

## 2019-07-14 DIAGNOSIS — M5411 Radiculopathy, occipito-atlanto-axial region: Secondary | ICD-10-CM | POA: Diagnosis not present

## 2019-07-14 DIAGNOSIS — M9901 Segmental and somatic dysfunction of cervical region: Secondary | ICD-10-CM | POA: Diagnosis not present

## 2019-07-14 DIAGNOSIS — M9903 Segmental and somatic dysfunction of lumbar region: Secondary | ICD-10-CM | POA: Diagnosis not present

## 2019-08-04 ENCOUNTER — Ambulatory Visit (INDEPENDENT_AMBULATORY_CARE_PROVIDER_SITE_OTHER): Payer: BC Managed Care – PPO

## 2019-08-04 ENCOUNTER — Other Ambulatory Visit: Payer: Self-pay

## 2019-08-04 ENCOUNTER — Encounter: Payer: Self-pay | Admitting: Podiatry

## 2019-08-04 ENCOUNTER — Other Ambulatory Visit: Payer: Self-pay | Admitting: Podiatry

## 2019-08-04 ENCOUNTER — Ambulatory Visit: Payer: BLUE CROSS/BLUE SHIELD | Admitting: Podiatry

## 2019-08-04 DIAGNOSIS — M722 Plantar fascial fibromatosis: Secondary | ICD-10-CM

## 2019-08-04 DIAGNOSIS — I8312 Varicose veins of left lower extremity with inflammation: Secondary | ICD-10-CM | POA: Diagnosis not present

## 2019-08-04 DIAGNOSIS — R300 Dysuria: Secondary | ICD-10-CM | POA: Insufficient documentation

## 2019-08-04 DIAGNOSIS — N926 Irregular menstruation, unspecified: Secondary | ICD-10-CM | POA: Insufficient documentation

## 2019-08-04 DIAGNOSIS — M7750 Other enthesopathy of unspecified foot: Secondary | ICD-10-CM

## 2019-08-04 DIAGNOSIS — Z9889 Other specified postprocedural states: Secondary | ICD-10-CM | POA: Insufficient documentation

## 2019-08-04 MED ORDER — MELOXICAM 15 MG PO TABS: 15.0000 mg | ORAL_TABLET | Freq: Every day | ORAL | 3 refills | Status: DC

## 2019-08-04 MED ORDER — METHYLPREDNISOLONE 4 MG PO TBPK: ORAL_TABLET | ORAL | 0 refills | Status: DC

## 2019-08-04 NOTE — Patient Instructions (Signed)

## 2019-08-07 NOTE — Progress Notes (Signed)
Subjective:  Patient ID: Kelly Calhoun, female    DOB: [birth year],  MRN: [id number] HPI Chief Complaint  Patient presents with  . 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 New Patient (Initial Visit)    49 y.o. female presents with the above complaint.   ROS: Denies fever chills nausea vomiting muscle aches pains calf pain back pain chest pain shortness of breath.  Past Medical History:  Diagnosis Date  . Complication of anesthesia    believes hair fell out after anesthesia, patient request epidural for surgery  . Crohn disease ([place])   . GERD (gastroesophageal reflux disease)    Past Surgical History:  Procedure Laterality Date  . BILATERAL SALPINGECTOMY Bilateral 09/13/2015   Procedure: BILATERAL SALPINGECTOMY;  Surgeon: Jerelyn Charles, MD;  Location: [facility];  Service: Gynecology;  Laterality: Bilateral;  . BLADDER SUSPENSION N/A 09/13/2015   Procedure: TRANSVAGINAL TAPE (TVT) PROCEDURE;  Surgeon: Bobbye Charleston, MD;  Location: [facility];  Service: Gynecology;  Laterality: N/A;  . BREAST ENHANCEMENT SURGERY    . CYSTOCELE REPAIR N/A 09/13/2015   Procedure: ANTERIOR REPAIR (CYSTOCELE);  Surgeon: Bobbye Charleston, MD;  Location: [facility];  Service: Gynecology;  Laterality: N/A;  . CYSTOSCOPY N/A 09/13/2015   Procedure: CYSTOSCOPY;  Surgeon: Jerelyn Charles, MD;  Location: [facility];  Service: Gynecology;  Laterality: N/A;  . LAPAROSCOPIC TUBAL LIGATION N/A 04/07/2014   Procedure: LAPAROSCOPIC TUBAL LIGATION;  Surgeon: Jerelyn Charles, MD;  Location: [facility];  Service: Gynecology;  Laterality: N/A;  . NASAL SEPTUM SURGERY    . [place] N/A 04/07/2014   Procedure: NOVASURE ABLATION;  Surgeon: Jerelyn Charles, MD;  Location: [facility];  Service: Gynecology;  Laterality: N/A;  . VAGINAL HYSTERECTOMY N/A  09/13/2015   Procedure: HYSTERECTOMY VAGINAL;  Surgeon: Jerelyn Charles, MD;  Location: [facility];  Service: Gynecology;  Laterality: N/A;    Current Outpatient Medications:  .  meloxicam (MOBIC) 15 MG tablet, Take 1 tablet (15 mg total) by mouth daily., Disp: 30 tablet, Rfl: 3 .  methylPREDNISolone (MEDROL DOSEPAK) 4 MG TBPK tablet, 6 day dose pack - take as directed, Disp: 21 tablet, Rfl: 0  No Known Allergies Review of Systems Objective:  There were no vitals filed for this visit.  General: Well developed, nourished, in no acute distress, alert and oriented x3   Dermatological: Skin is warm, dry and supple bilateral. Nails x 10 are well maintained; remaining integument appears unremarkable at this time. There are no open sores, no preulcerative lesions, no rash or signs of infection present.  Vascular: Dorsalis Pedis artery and Posterior Tibial artery pedal pulses are 2/4 bilateral with immedate capillary fill time. Pedal hair growth present. No varicosities and no lower extremity edema present bilateral.   Neruologic: Grossly intact via light touch bilateral. Vibratory intact via tuning fork bilateral. Protective threshold with Semmes Wienstein monofilament intact to all pedal sites bilateral. Patellar and Achilles deep tendon reflexes 2+ bilateral. No Babinski or clonus noted bilateral.   Musculoskeletal: No gross boney pedal deformities bilateral. No pain, crepitus, or limitation noted with foot and ankle range of motion bilateral. Muscular strength 5/5 in all groups tested bilateral.  Pain on palpation posterior tibial tendon pain on palpation medial calcaneal tubercle.  Gait: Unassisted, Nonantalgic.    Radiographs:  Radiographs demonstrate an  osseously mature individual no acute injuries noted.  Soft tissue increase in density plantar calcaneal insertion site and along posterior tibial tendon area to indicate posterior tibial tendinitis and plantar fasciitis.    Assessment & Plan:    Assessment: Plantar fasciitis and posterior tibial tendinitis  Plan: Discussed etiology pathology conservative surgical therapies start her on methylprednisolone to be followed by meloxicam.  Placed her plantar fascial brace and injected the heel today with 20 mg Kenalog 5 mg Marcaine.  We did not inject the posterior tibial tendon area.  Discussed appropriate shoe gear stretching exercise ice therapy shoe gear modifications.     Kelly Calhoun. [place], [place]

## 2019-08-12 DIAGNOSIS — I8312 Varicose veins of left lower extremity with inflammation: Secondary | ICD-10-CM | POA: Diagnosis not present

## 2019-09-06 ENCOUNTER — Encounter: Payer: Self-pay | Admitting: Podiatry

## 2019-09-06 ENCOUNTER — Other Ambulatory Visit: Payer: Self-pay

## 2019-09-06 ENCOUNTER — Ambulatory Visit (INDEPENDENT_AMBULATORY_CARE_PROVIDER_SITE_OTHER): Payer: BC Managed Care – PPO | Admitting: Podiatry

## 2019-09-06 DIAGNOSIS — M722 Plantar fascial fibromatosis: Secondary | ICD-10-CM

## 2019-09-07 NOTE — Progress Notes (Signed)
She presents today for follow-up of her plantar fasciitis and posterior tibial tendinitis of the left.  States that is much better than it was but is not completely gone away yet.  She states that it feels like it is approximately 50% improved.  Objective: Vital signs are stable she is alert and oriented x3 posterior tibial tendon is nontender on palpation of the plantar fascia is tender.  Pulses remain palpable no open lesions or wounds are noted.  Assessment: Resolving posterior tibial tendinitis plantar fasciitis is resolving.  But still tender.  Plan: At this point I would ask her to continue her medications I injected the plantar fascia today with 20 mg Kenalog 5 mg Marcaine point of maximal tenderness.  None like to follow-up with in 1 month to 6 weeks.

## 2019-10-06 ENCOUNTER — Ambulatory Visit: Payer: BC Managed Care – PPO | Admitting: Podiatry

## 2019-11-08 DIAGNOSIS — J020 Streptococcal pharyngitis: Secondary | ICD-10-CM | POA: Diagnosis not present

## 2019-11-08 DIAGNOSIS — Z1152 Encounter for screening for COVID-19: Secondary | ICD-10-CM | POA: Diagnosis not present

## 2019-11-08 DIAGNOSIS — J029 Acute pharyngitis, unspecified: Secondary | ICD-10-CM | POA: Diagnosis not present

## 2019-11-10 ENCOUNTER — Ambulatory Visit: Payer: BC Managed Care – PPO | Admitting: Podiatry

## 2019-11-15 DIAGNOSIS — K1120 Sialoadenitis, unspecified: Secondary | ICD-10-CM | POA: Diagnosis not present

## 2019-12-05 ENCOUNTER — Other Ambulatory Visit: Payer: Self-pay | Admitting: Podiatry

## 2019-12-05 NOTE — Telephone Encounter (Signed)
Please advise 

## 2019-12-30 DIAGNOSIS — E781 Pure hyperglyceridemia: Secondary | ICD-10-CM | POA: Diagnosis not present

## 2019-12-30 DIAGNOSIS — R7301 Impaired fasting glucose: Secondary | ICD-10-CM | POA: Diagnosis not present

## 2019-12-30 DIAGNOSIS — Z0000 Encounter for general adult medical examination without abnormal findings: Secondary | ICD-10-CM | POA: Diagnosis not present

## 2020-01-02 DIAGNOSIS — Z1339 Encounter for screening examination for other mental health and behavioral disorders: Secondary | ICD-10-CM | POA: Diagnosis not present

## 2020-01-02 DIAGNOSIS — Z1331 Encounter for screening for depression: Secondary | ICD-10-CM | POA: Diagnosis not present

## 2020-01-02 DIAGNOSIS — E781 Pure hyperglyceridemia: Secondary | ICD-10-CM | POA: Diagnosis not present

## 2020-01-02 DIAGNOSIS — Z0000 Encounter for general adult medical examination without abnormal findings: Secondary | ICD-10-CM | POA: Diagnosis not present

## 2020-01-17 DIAGNOSIS — L821 Other seborrheic keratosis: Secondary | ICD-10-CM | POA: Diagnosis not present

## 2020-01-17 DIAGNOSIS — L430 Hypertrophic lichen planus: Secondary | ICD-10-CM | POA: Diagnosis not present

## 2020-01-17 DIAGNOSIS — Z85828 Personal history of other malignant neoplasm of skin: Secondary | ICD-10-CM | POA: Diagnosis not present

## 2020-01-17 DIAGNOSIS — L814 Other melanin hyperpigmentation: Secondary | ICD-10-CM | POA: Diagnosis not present

## 2020-01-17 DIAGNOSIS — D225 Melanocytic nevi of trunk: Secondary | ICD-10-CM | POA: Diagnosis not present

## 2020-01-17 DIAGNOSIS — D485 Neoplasm of uncertain behavior of skin: Secondary | ICD-10-CM | POA: Diagnosis not present

## 2020-06-12 ENCOUNTER — Ambulatory Visit: Payer: BC Managed Care – PPO | Admitting: Podiatry

## 2020-06-12 ENCOUNTER — Encounter: Payer: Self-pay | Admitting: Podiatry

## 2020-06-12 ENCOUNTER — Other Ambulatory Visit: Payer: Self-pay

## 2020-06-12 ENCOUNTER — Ambulatory Visit (INDEPENDENT_AMBULATORY_CARE_PROVIDER_SITE_OTHER): Payer: BC Managed Care – PPO

## 2020-06-12 DIAGNOSIS — M778 Other enthesopathies, not elsewhere classified: Secondary | ICD-10-CM

## 2020-06-12 DIAGNOSIS — S92414A Nondisplaced fracture of proximal phalanx of right great toe, initial encounter for closed fracture: Secondary | ICD-10-CM

## 2020-06-12 NOTE — Progress Notes (Signed)
She presents today chief concern of pain to the first metatarsophalangeal joint of the right foot.  States is been hurting for the past few weeks by last Thursday she tripped over a ladder and jammed the toe.  States that it was very bruised and swollen which is gone down.  States that it is a little better but still taping it.  Objective: Vital signs are stable she is alert and oriented x3.  Pulses are palpable.  No erythema no ecchymosis but just some mild edema overlying the proximal lateral aspect of the hallux at the metatarsophalangeal joint right.  Mildly to moderately tender on palpation.  Radiographs taken today demonstrate a fracture of the proximal condylar portion of the proximal phalanx laterally.  There is no comminution it is an intra-articular fracture.  Assessment: Fracture proximal phalanx hallux right.  Plan: Discussed etiology pathology and surgical therapies at this point time I put her in a Darco shoe I want this fracture to go on to heal before we evaluate the tenderness that she was initially having in the metatarsophalangeal joint itself.

## 2020-09-06 ENCOUNTER — Other Ambulatory Visit: Payer: Self-pay | Admitting: Obstetrics

## 2020-09-06 DIAGNOSIS — Z1231 Encounter for screening mammogram for malignant neoplasm of breast: Secondary | ICD-10-CM

## 2020-09-11 ENCOUNTER — Other Ambulatory Visit: Payer: Self-pay

## 2020-09-11 ENCOUNTER — Encounter: Payer: Self-pay | Admitting: Podiatry

## 2020-09-11 ENCOUNTER — Ambulatory Visit (INDEPENDENT_AMBULATORY_CARE_PROVIDER_SITE_OTHER): Payer: BC Managed Care – PPO

## 2020-09-11 ENCOUNTER — Other Ambulatory Visit: Payer: Self-pay | Admitting: Podiatry

## 2020-09-11 ENCOUNTER — Ambulatory Visit: Payer: BC Managed Care – PPO | Admitting: Podiatry

## 2020-09-11 DIAGNOSIS — S90111D Contusion of right great toe without damage to nail, subsequent encounter: Secondary | ICD-10-CM

## 2020-09-11 DIAGNOSIS — S90111A Contusion of right great toe without damage to nail, initial encounter: Secondary | ICD-10-CM

## 2020-09-11 NOTE — Progress Notes (Signed)
She presents today for follow-up of her fractured toe hallux right.  States that is still sore but is feeling better than it was.  She states that she did not wear the Darco shoe that she took it off the second she got to the car and never wore it again.  Objective: Vital signs stable alert orient x3 is no erythema edema/drainage odor.  No ecchymosis.  Mild tenderness on palpation of the proximal phalanx medial lateral compression.  There is no pain on dorsiflexion plantarflexion.  Radiographs demonstrate a healing proximal phalanx no dislocation no comminution.  Assessment: Well-healing fracture toe right.  Plan: Follow-up with me in about 6 weeks for another set of x-rays.

## 2020-10-09 DIAGNOSIS — M542 Cervicalgia: Secondary | ICD-10-CM | POA: Diagnosis not present

## 2020-10-16 DIAGNOSIS — M542 Cervicalgia: Secondary | ICD-10-CM | POA: Diagnosis not present

## 2020-10-30 DIAGNOSIS — M542 Cervicalgia: Secondary | ICD-10-CM | POA: Diagnosis not present

## 2020-11-13 DIAGNOSIS — M542 Cervicalgia: Secondary | ICD-10-CM | POA: Diagnosis not present

## 2020-12-18 ENCOUNTER — Ambulatory Visit
Admission: RE | Admit: 2020-12-18 | Discharge: 2020-12-18 | Disposition: A | Discharge status: Home / Self Care | Payer: BC Managed Care – PPO | Source: Ambulatory Visit | Attending: Obstetrics | Admitting: Obstetrics

## 2020-12-18 ENCOUNTER — Other Ambulatory Visit: Payer: Self-pay

## 2020-12-18 DIAGNOSIS — Z1231 Encounter for screening mammogram for malignant neoplasm of breast: Secondary | ICD-10-CM

## 2021-01-16 DIAGNOSIS — L821 Other seborrheic keratosis: Secondary | ICD-10-CM | POA: Diagnosis not present

## 2021-01-16 DIAGNOSIS — L814 Other melanin hyperpigmentation: Secondary | ICD-10-CM | POA: Diagnosis not present

## 2021-01-16 DIAGNOSIS — Z85828 Personal history of other malignant neoplasm of skin: Secondary | ICD-10-CM | POA: Diagnosis not present

## 2021-01-16 DIAGNOSIS — D2272 Melanocytic nevi of left lower limb, including hip: Secondary | ICD-10-CM | POA: Diagnosis not present

## 2021-02-01 DIAGNOSIS — R7989 Other specified abnormal findings of blood chemistry: Secondary | ICD-10-CM | POA: Diagnosis not present

## 2021-02-01 DIAGNOSIS — E7800 Pure hypercholesterolemia, unspecified: Secondary | ICD-10-CM | POA: Diagnosis not present

## 2021-02-06 DIAGNOSIS — Z1339 Encounter for screening examination for other mental health and behavioral disorders: Secondary | ICD-10-CM | POA: Diagnosis not present

## 2021-02-06 DIAGNOSIS — E7800 Pure hypercholesterolemia, unspecified: Secondary | ICD-10-CM | POA: Diagnosis not present

## 2021-02-06 DIAGNOSIS — Z23 Encounter for immunization: Secondary | ICD-10-CM | POA: Diagnosis not present

## 2021-02-06 DIAGNOSIS — Z1331 Encounter for screening for depression: Secondary | ICD-10-CM | POA: Diagnosis not present

## 2021-02-06 DIAGNOSIS — Z0000 Encounter for general adult medical examination without abnormal findings: Secondary | ICD-10-CM | POA: Diagnosis not present

## 2021-02-06 DIAGNOSIS — R7301 Impaired fasting glucose: Secondary | ICD-10-CM | POA: Diagnosis not present

## 2021-02-07 DIAGNOSIS — R7301 Impaired fasting glucose: Secondary | ICD-10-CM | POA: Diagnosis not present

## 2021-03-08 DIAGNOSIS — M25511 Pain in right shoulder: Secondary | ICD-10-CM | POA: Insufficient documentation

## 2021-03-11 DIAGNOSIS — M810 Age-related osteoporosis without current pathological fracture: Secondary | ICD-10-CM | POA: Diagnosis not present

## 2021-03-12 DIAGNOSIS — M25512 Pain in left shoulder: Secondary | ICD-10-CM | POA: Diagnosis not present

## 2021-03-12 DIAGNOSIS — M25511 Pain in right shoulder: Secondary | ICD-10-CM | POA: Diagnosis not present

## 2021-05-08 DIAGNOSIS — M810 Age-related osteoporosis without current pathological fracture: Secondary | ICD-10-CM | POA: Diagnosis not present

## 2021-05-27 ENCOUNTER — Other Ambulatory Visit (HOSPITAL_COMMUNITY): Payer: Self-pay | Admitting: *Deleted

## 2021-05-28 ENCOUNTER — Ambulatory Visit (HOSPITAL_COMMUNITY)
Admission: RE | Admit: 2021-05-28 | Discharge: 2021-05-28 | Disposition: A | Discharge status: Home / Self Care | Payer: BC Managed Care – PPO | Source: Ambulatory Visit | Attending: Internal Medicine | Admitting: Internal Medicine

## 2021-05-28 DIAGNOSIS — M810 Age-related osteoporosis without current pathological fracture: Secondary | ICD-10-CM | POA: Insufficient documentation

## 2021-05-28 MED ORDER — ZOLEDRONIC ACID 5 MG/100ML IV SOLN: 5.0000 mg | Freq: Once | INTRAVENOUS | Status: DC

## 2021-05-28 MED ORDER — ZOLEDRONIC ACID 5 MG/100ML IV SOLN
INTRAVENOUS | Status: AC
  Administered 2021-05-28: 5 mg
  Filled 2021-05-28: qty 100

## 2021-06-05 DIAGNOSIS — S60222A Contusion of left hand, initial encounter: Secondary | ICD-10-CM | POA: Diagnosis not present

## 2021-06-05 DIAGNOSIS — M79642 Pain in left hand: Secondary | ICD-10-CM | POA: Insufficient documentation

## 2021-06-06 DIAGNOSIS — S60222A Contusion of left hand, initial encounter: Secondary | ICD-10-CM | POA: Insufficient documentation

## 2021-06-11 ENCOUNTER — Ambulatory Visit (INDEPENDENT_AMBULATORY_CARE_PROVIDER_SITE_OTHER): Payer: BC Managed Care – PPO | Admitting: Obstetrics and Gynecology

## 2021-06-11 ENCOUNTER — Encounter: Payer: Self-pay | Admitting: Obstetrics and Gynecology

## 2021-06-11 VITALS — BP 122/67 | HR 88 | Ht 64.5 in | Wt 138.0 lb

## 2021-06-11 DIAGNOSIS — Z8739 Personal history of other diseases of the musculoskeletal system and connective tissue: Secondary | ICD-10-CM

## 2021-06-11 DIAGNOSIS — Z01419 Encounter for gynecological examination (general) (routine) without abnormal findings: Secondary | ICD-10-CM | POA: Diagnosis not present

## 2021-06-11 DIAGNOSIS — R635 Abnormal weight gain: Secondary | ICD-10-CM

## 2021-06-11 DIAGNOSIS — K921 Melena: Secondary | ICD-10-CM | POA: Insufficient documentation

## 2021-06-11 DIAGNOSIS — R232 Flushing: Secondary | ICD-10-CM

## 2021-06-11 DIAGNOSIS — K5010 Crohn's disease of large intestine without complications: Secondary | ICD-10-CM | POA: Insufficient documentation

## 2021-06-11 DIAGNOSIS — Z860100 Personal history of colon polyps, unspecified: Secondary | ICD-10-CM | POA: Insufficient documentation

## 2021-06-11 DIAGNOSIS — K625 Hemorrhage of anus and rectum: Secondary | ICD-10-CM | POA: Insufficient documentation

## 2021-06-11 DIAGNOSIS — R61 Generalized hyperhidrosis: Secondary | ICD-10-CM | POA: Diagnosis not present

## 2021-06-11 DIAGNOSIS — K219 Gastro-esophageal reflux disease without esophagitis: Secondary | ICD-10-CM | POA: Insufficient documentation

## 2021-06-11 DIAGNOSIS — R197 Diarrhea, unspecified: Secondary | ICD-10-CM | POA: Insufficient documentation

## 2021-06-11 MED ORDER — ESTRADIOL 0.05 MG/24HR TD PTTW: 1.0000 | MEDICATED_PATCH | TRANSDERMAL | 1 refills | Status: DC

## 2021-06-11 NOTE — Patient Instructions (Signed)

## 2021-06-11 NOTE — Progress Notes (Addendum)
51 y.o. No obstetric history on file. Married White or Caucasian Not Hispanic or Latino female here for annual exam.  Vaginal dryness.    8/17 TVH, BS, anterior repair, TVT (Small portion of left fallopian tube containing the filshie clip left in situ). She went off of ERT a few years ago. Horrible hot flashes, night sweats, mood changes. Up 3-4 x a night. Night time is the worst.   She c/o weight gain.   H/O Crohns  She c/o frequent urination, normal amounts.   Patient's last menstrual period was 09/09/2015.          Sexually active: Yes.    The current method of family planning is status post hysterectomy.    Exercising: Yes.     Pure [place]  Smoker:  no  Health Maintenance: Pap:  HX Hysterectomy  History of abnormal Pap:  yes, no surgery on her cervix.  MMG:  12/18/20 Bi-rads 1 neg BMD:   Dr Brigitte Pulse Osteoporosis, she had reclast in 3/23 and had a bad reaction  Colonoscopy: has scheduled for end of May. Has them every 2-3 years.  TDaP:  Unsure  Gardasil: n/a    reports that she has never smoked. She has never used smokeless tobacco. She reports current alcohol use of about 2.0 standard drinks per week. She reports that she does not use drugs.  Past Medical History:  Diagnosis Date   Complication of anesthesia    believes hair fell out after anesthesia, patient request epidural for surgery   Crohn disease ([place])    GERD (gastroesophageal reflux disease)    Heart murmur    Hormone disorder    Osteoporosis     Past Surgical History:  Procedure Laterality Date   ABDOMINAL HYSTERECTOMY     AUGMENTATION MAMMAPLASTY     BILATERAL SALPINGECTOMY Bilateral 09/13/2015   Procedure: BILATERAL SALPINGECTOMY;  Surgeon: Jerelyn Charles, MD;  Location: [facility];  Service: Gynecology;  Laterality: Bilateral;   BLADDER SUSPENSION N/A 09/13/2015   Procedure: TRANSVAGINAL TAPE (TVT) PROCEDURE;  Surgeon: Bobbye Charleston, MD;  Location: [facility];  Service: Gynecology;  Laterality: N/A;   BREAST  ENHANCEMENT SURGERY     CYSTOCELE REPAIR N/A 09/13/2015   Procedure: ANTERIOR REPAIR (CYSTOCELE);  Surgeon: Bobbye Charleston, MD;  Location: [facility];  Service: Gynecology;  Laterality: N/A;   CYSTOSCOPY N/A 09/13/2015   Procedure: CYSTOSCOPY;  Surgeon: Jerelyn Charles, MD;  Location: [facility];  Service: Gynecology;  Laterality: N/A;   LAPAROSCOPIC TUBAL LIGATION N/A 04/07/2014   Procedure: LAPAROSCOPIC TUBAL LIGATION;  Surgeon: Jerelyn Charles, MD;  Location: [facility];  Service: Gynecology;  Laterality: N/A;   NASAL SEPTUM SURGERY     NOVASURE ABLATION N/A 04/07/2014   Procedure: NOVASURE ABLATION;  Surgeon: Jerelyn Charles, MD;  Location: [facility];  Service: Gynecology;  Laterality: N/A;   VAGINAL HYSTERECTOMY N/A 09/13/2015   Procedure: HYSTERECTOMY VAGINAL;  Surgeon: Jerelyn Charles, MD;  Location: [facility];  Service: Gynecology;  Laterality: N/A;    Current Outpatient Medications  Medication Sig Dispense Refill   Calcium Carb-Cholecalciferol 600-5 MG-MCG TABS 1 tablet with a meal     Multiple Vitamin (MULTIVITAMIN) tablet Take 1 tablet by mouth daily.     zoledronic acid (RECLAST) 5 MG/100ML SOLN injection infuse '5mg'$      No current facility-administered medications for this visit.    Family History  Problem Relation Age of Onset   Cancer Mother    Breast cancer Maternal Grandmother        80s  Review of Systems  All other systems reviewed and are negative.  Exam:   BP 122/67   Pulse 88   Ht 5' 4.5" (1.638 m)   Wt 138 lb (62.6 kg)   LMP 09/09/2015   SpO2 100%   BMI 23.32 kg/m   Weight change: '@WEIGHTCHANGE'$ @ Height:   Height: 5' 4.5" (163.8 cm)  Ht Readings from Last 3 Encounters:  06/11/21 5' 4.5" (1.638 m)  09/13/15 '5\' 4"'$  (1.626 m)  08/30/15 5' 4.5" (1.638 m)    General appearance: alert, cooperative and appears stated age Head: Normocephalic, without obvious abnormality, atraumatic Neck: no adenopathy, supple, symmetrical, trachea midline and thyroid normal to inspection and  palpation Lungs: clear to auscultation bilaterally Cardiovascular: regular rate and rhythm Breasts: normal appearance, no masses or tenderness, bilateral soft implants Abdomen: soft, non-tender; non distended,  no masses,  no organomegaly Extremities: extremities normal, atraumatic, no cyanosis or edema Skin: Skin color, texture, turgor normal. No rashes or lesions Lymph nodes: Cervical, supraclavicular, and axillary nodes normal. No abnormal inguinal nodes palpated Neurologic: Grossly normal   Pelvic: External genitalia:  no lesions              Urethra:  normal appearing urethra with no masses, tenderness or lesions              Bartholins and Skenes: normal                 Vagina: normal appearing vagina with normal color and discharge, no lesions. No significant prolapse.              Cervix: absent               Bimanual Exam:  Uterus:  uterus absent              Adnexa: no mass, fullness, tenderness               Rectovaginal: Confirms               Anus:  normal sphincter tone, no lesions  1. Well woman exam Discussed breast self exam Discussed calcium and vit D intake Mammogram UTD Colonoscopy is scheduled  2. Hot flashes No contraindications to ERT, risks reviewed - estradiol (VIVELLE-DOT) 0.05 MG/24HR patch; Place 1 patch (0.05 mg total) onto the skin 2 (two) times a week.  Dispense: 8 patch; Refill: 1  3. Night sweats No contraindications to ERT, risks reviewed - estradiol (VIVELLE-DOT) 0.05 MG/24HR patch; Place 1 patch (0.05 mg total) onto the skin 2 (two) times a week.  Dispense: 8 patch; Refill: 1  4. Weight gain Thinks she had TSH tested with her primary  5. History of osteoporosis She had a bad reaction to reclast in 3/23. Will get a copy of her last DEXA She is getting calcium and vit d Discussed that ERT will be helpful for her osteoporosis.  Addendum: Records reviewed, will be scanned. 12/22 significant labs: TSH 3.32, Calcium 9.8, vit d  140 Cholesterol 266, trig 76, HDL 75, LDL 176 Normal hgb, renal function, lft's and hgbA1c 03/11/21 DEXA T score -2.6   The cardiac risk calculator estimates her risk of heart disease at 7.3% (with smoking). I let her know at this level she should discuss possible statin use with her primary (in addition to a healthy diet and exercise), she has already spoken with him.

## 2021-07-03 DIAGNOSIS — Z86010 Personal history of colonic polyps: Secondary | ICD-10-CM | POA: Diagnosis not present

## 2021-07-03 DIAGNOSIS — K6389 Other specified diseases of intestine: Secondary | ICD-10-CM | POA: Diagnosis not present

## 2021-07-03 DIAGNOSIS — K5010 Crohn's disease of large intestine without complications: Secondary | ICD-10-CM | POA: Diagnosis not present

## 2021-07-03 DIAGNOSIS — K633 Ulcer of intestine: Secondary | ICD-10-CM | POA: Diagnosis not present

## 2021-07-03 DIAGNOSIS — K648 Other hemorrhoids: Secondary | ICD-10-CM | POA: Diagnosis not present

## 2021-07-03 DIAGNOSIS — K5289 Other specified noninfective gastroenteritis and colitis: Secondary | ICD-10-CM | POA: Diagnosis not present

## 2021-07-25 ENCOUNTER — Other Ambulatory Visit: Payer: Self-pay | Admitting: Obstetrics and Gynecology

## 2021-07-25 MED ORDER — ESTRADIOL 0.075 MG/24HR TD PTTW
1.0000 | MEDICATED_PATCH | TRANSDERMAL | 1 refills | Status: DC
Start: 2021-07-25 — End: 2021-09-23

## 2021-07-25 NOTE — Progress Notes (Signed)
Patient lost her patch. Still having some night sweats. Wants to try the next higher dose. Will send in a 1 month supply with one refill. She is having some issues with the patch sticking, may consider the gel.

## 2021-08-04 ENCOUNTER — Other Ambulatory Visit: Payer: Self-pay | Admitting: Obstetrics and Gynecology

## 2021-08-04 DIAGNOSIS — R232 Flushing: Secondary | ICD-10-CM

## 2021-08-04 DIAGNOSIS — R61 Generalized hyperhidrosis: Secondary | ICD-10-CM

## 2021-08-15 NOTE — Telephone Encounter (Signed)
I called patient again and she picked up . Patient said she is taking 0.075 mg dose and to deny the 0.05 mg dose.

## 2021-09-21 ENCOUNTER — Other Ambulatory Visit: Payer: Self-pay | Admitting: Obstetrics and Gynecology

## 2021-09-23 NOTE — Telephone Encounter (Signed)
Last annual exam was 06/11/21 Mammogram 12/2020

## 2021-11-01 ENCOUNTER — Other Ambulatory Visit: Payer: BC Managed Care – PPO

## 2021-11-01 ENCOUNTER — Other Ambulatory Visit: Payer: Self-pay | Admitting: Obstetrics and Gynecology

## 2021-11-01 DIAGNOSIS — R350 Frequency of micturition: Secondary | ICD-10-CM | POA: Diagnosis not present

## 2021-11-01 LAB — URINALYSIS, COMPLETE
Bacteria, UA: NONE SEEN /HPF
Bilirubin Urine: NEGATIVE
Casts: NONE SEEN /LPF
Crystals: NONE SEEN /HPF
Glucose, UA: NEGATIVE
Hgb urine dipstick: NEGATIVE
Hyaline Cast: NONE SEEN /LPF
Leukocytes,Ua: NEGATIVE
Nitrite: NEGATIVE
Protein, ur: NEGATIVE
RBC / HPF: NONE SEEN /HPF (ref 0–2)
Specific Gravity, Urine: 1.025 (ref 1.001–1.035)
WBC, UA: NONE SEEN /HPF (ref 0–5)
Yeast: NONE SEEN /HPF
pH: 6 (ref 5.0–8.0)

## 2021-11-01 NOTE — Progress Notes (Signed)
Patient with uti symptoms, will come in to do ccua

## 2021-11-02 LAB — URINE CULTURE
MICRO NUMBER:: 13956019
SPECIMEN QUALITY:: ADEQUATE

## 2021-12-02 DIAGNOSIS — H05011 Cellulitis of right orbit: Secondary | ICD-10-CM | POA: Diagnosis not present

## 2021-12-02 DIAGNOSIS — H00022 Hordeolum internum right lower eyelid: Secondary | ICD-10-CM | POA: Diagnosis not present

## 2021-12-23 ENCOUNTER — Ambulatory Visit: Payer: BC Managed Care – PPO | Admitting: Obstetrics and Gynecology

## 2022-01-14 DIAGNOSIS — Z85828 Personal history of other malignant neoplasm of skin: Secondary | ICD-10-CM | POA: Diagnosis not present

## 2022-01-14 DIAGNOSIS — L814 Other melanin hyperpigmentation: Secondary | ICD-10-CM | POA: Diagnosis not present

## 2022-01-14 DIAGNOSIS — L570 Actinic keratosis: Secondary | ICD-10-CM | POA: Diagnosis not present

## 2022-01-14 DIAGNOSIS — D485 Neoplasm of uncertain behavior of skin: Secondary | ICD-10-CM | POA: Diagnosis not present

## 2022-01-14 DIAGNOSIS — L821 Other seborrheic keratosis: Secondary | ICD-10-CM | POA: Diagnosis not present

## 2022-02-17 ENCOUNTER — Other Ambulatory Visit: Payer: Self-pay | Admitting: Obstetrics

## 2022-02-17 DIAGNOSIS — Z1231 Encounter for screening mammogram for malignant neoplasm of breast: Secondary | ICD-10-CM

## 2022-02-20 DIAGNOSIS — E7800 Pure hypercholesterolemia, unspecified: Secondary | ICD-10-CM | POA: Diagnosis not present

## 2022-02-20 DIAGNOSIS — R7301 Impaired fasting glucose: Secondary | ICD-10-CM | POA: Diagnosis not present

## 2022-02-20 DIAGNOSIS — M810 Age-related osteoporosis without current pathological fracture: Secondary | ICD-10-CM | POA: Diagnosis not present

## 2022-02-20 LAB — LAB REPORT - SCANNED
A1c: 5.3
EGFR (Non-African Amer.): 105.4

## 2022-02-27 DIAGNOSIS — Z1331 Encounter for screening for depression: Secondary | ICD-10-CM | POA: Diagnosis not present

## 2022-02-27 DIAGNOSIS — R7301 Impaired fasting glucose: Secondary | ICD-10-CM | POA: Diagnosis not present

## 2022-02-27 DIAGNOSIS — Z0000 Encounter for general adult medical examination without abnormal findings: Secondary | ICD-10-CM | POA: Diagnosis not present

## 2022-02-27 DIAGNOSIS — K5090 Crohn's disease, unspecified, without complications: Secondary | ICD-10-CM | POA: Diagnosis not present

## 2022-02-27 DIAGNOSIS — M791 Myalgia, unspecified site: Secondary | ICD-10-CM | POA: Diagnosis not present

## 2022-02-27 DIAGNOSIS — Z1339 Encounter for screening examination for other mental health and behavioral disorders: Secondary | ICD-10-CM | POA: Diagnosis not present

## 2022-04-08 ENCOUNTER — Ambulatory Visit
Admission: RE | Admit: 2022-04-08 | Discharge: 2022-04-08 | Disposition: A | Discharge status: Home / Self Care | Payer: BC Managed Care – PPO | Source: Ambulatory Visit | Attending: Obstetrics | Admitting: Obstetrics

## 2022-04-08 DIAGNOSIS — Z1231 Encounter for screening mammogram for malignant neoplasm of breast: Secondary | ICD-10-CM

## 2022-05-30 DIAGNOSIS — M20021 Boutonniere deformity of right finger(s): Secondary | ICD-10-CM | POA: Diagnosis not present

## 2022-06-04 DIAGNOSIS — M810 Age-related osteoporosis without current pathological fracture: Secondary | ICD-10-CM | POA: Diagnosis not present

## 2022-06-04 DIAGNOSIS — S62602A Fracture of unspecified phalanx of right middle finger, initial encounter for closed fracture: Secondary | ICD-10-CM | POA: Diagnosis not present

## 2022-06-09 DIAGNOSIS — M79644 Pain in right finger(s): Secondary | ICD-10-CM | POA: Diagnosis not present

## 2022-06-24 DIAGNOSIS — M79644 Pain in right finger(s): Secondary | ICD-10-CM | POA: Diagnosis not present

## 2022-06-24 DIAGNOSIS — M20021 Boutonniere deformity of right finger(s): Secondary | ICD-10-CM | POA: Diagnosis not present

## 2022-07-08 NOTE — Progress Notes (Addendum)
52 y.o. [id number] Married White or Caucasian Not Hispanic or Latino female here for annual exam.  H/o TVH/BS/anterior repair/TVT. She is on the estrogen patch, she doesn't like the patch, it doesn't stay on well. Would like to try something else.   She had some bleeding last weekend. Not sexually active currently. No pain. No urinary c/o.   H/O osteoporosis, last year she tried reclast and had a bad reaction to it. Primary recommended she start Prolia. She hasn't tried Fosamax. Her lab work with her primary a few months ago showed her vit d was very elevated at 140. She isn't on supplemental vit D or calcium.      Patient's last menstrual period was 09/09/2015.          Sexually active: Yes.    The current method of family planning is status post hysterectomy.    Exercising: Yes.     She is active  Smoker:  no  Health Maintenance: Pap:  history of hysterectomy  History of abnormal Pap:  yes no surgery on her cervix  MMG:  04/10/22 Bi-rads 1 neg  BMD:   03/11/21    Dr Clelia Croft Osteoporosis, she had reclast in 3/23 and had a bad reaction. Dr Clelia Croft recommended she change to prolia.   Colonoscopy: has done every 2-3 years  TDaP:  unsure  Gardasil: n/a   reports that she has never smoked. She has never used smokeless tobacco. She reports current alcohol use of about 2.0 standard drinks of alcohol per week. She reports that she does not use drugs.  Past Medical History:  Diagnosis Date   Complication of anesthesia    believes hair fell out after anesthesia, patient request epidural for surgery   Crohn disease ([facility])    GERD (gastroesophageal reflux disease)    Heart murmur    Hormone disorder    Osteoporosis     Past Surgical History:  Procedure Laterality Date   ABDOMINAL HYSTERECTOMY     AUGMENTATION MAMMAPLASTY     BILATERAL SALPINGECTOMY Bilateral 09/13/2015   Procedure: BILATERAL SALPINGECTOMY;  Surgeon: Marlow Baars, MD;  Location: [facility];  Service: Gynecology;  Laterality:  Bilateral;   BLADDER SUSPENSION N/A 09/13/2015   Procedure: TRANSVAGINAL TAPE (TVT) PROCEDURE;  Surgeon: Carrington Clamp, MD;  Location: [facility];  Service: Gynecology;  Laterality: N/A;   BREAST ENHANCEMENT SURGERY     CYSTOCELE REPAIR N/A 09/13/2015   Procedure: ANTERIOR REPAIR (CYSTOCELE);  Surgeon: Carrington Clamp, MD;  Location: [facility];  Service: Gynecology;  Laterality: N/A;   CYSTOSCOPY N/A 09/13/2015   Procedure: CYSTOSCOPY;  Surgeon: Marlow Baars, MD;  Location: [facility];  Service: Gynecology;  Laterality: N/A;   LAPAROSCOPIC TUBAL LIGATION N/A 04/07/2014   Procedure: LAPAROSCOPIC TUBAL LIGATION;  Surgeon: Marlow Baars, MD;  Location: [facility];  Service: Gynecology;  Laterality: N/A;   NASAL SEPTUM SURGERY     NOVASURE ABLATION N/A 04/07/2014   Procedure: NOVASURE ABLATION;  Surgeon: Marlow Baars, MD;  Location: [facility];  Service: Gynecology;  Laterality: N/A;   VAGINAL HYSTERECTOMY N/A 09/13/2015   Procedure: HYSTERECTOMY VAGINAL;  Surgeon: Marlow Baars, MD;  Location: [facility];  Service: Gynecology;  Laterality: N/A;    Current Outpatient Medications  Medication Sig Dispense Refill   estradiol (VIVELLE-DOT) 0.075 MG/24HR APPLY 1 PATCH TOPICALLY TO THE SKIN 2 TIMES A WEEK 24 patch 3   No current facility-administered medications for this visit.    Family History  Problem Relation Age of Onset   Cancer Mother  Breast cancer Maternal Grandmother        8s    Review of Systems  All other systems reviewed and are negative.   Exam:   BP 122/74   Pulse 78   Ht 5\' 4"  (1.626 m)   Wt 126 lb (57.2 kg)   LMP 09/09/2015   SpO2 100%   BMI 21.63 kg/m   Weight change: @WEIGHTCHANGE @ Height:   Height: 5\' 4"  (162.6 cm)  Ht Readings from Last 3 Encounters:  07/10/22 5\' 4"  (1.626 m)  06/11/21 5' 4.5" (1.638 m)  09/13/15 5\' 4"  (1.626 m)    General appearance: alert, cooperative and appears stated age Head: Normocephalic, without obvious abnormality, atraumatic Neck: no  adenopathy, supple, symmetrical, trachea midline and thyroid normal to inspection and palpation Lungs: clear to auscultation bilaterally Cardiovascular: regular rate and rhythm Breasts: normal appearance, no masses or tenderness, bilaterally soft implants Abdomen: soft, non-tender; non distended,  no masses,  no organomegaly Extremities: extremities normal, atraumatic, no cyanosis or edema Skin: Skin color, texture, turgor normal. No rashes or lesions Lymph nodes: Cervical, supraclavicular, and axillary nodes normal. No abnormal inguinal nodes palpated Neurologic: Grossly normal   Pelvic: External genitalia:  no lesions              Urethra:  normal appearing urethra with no masses, tenderness or lesions              Bartholins and Skenes: normal                 Vagina: normal appearing vagina with normal color and discharge, no lesions, no mesh erosion seen (h/o TVT), slight roughness felt in the anterior vagina a few cm in from the introitus.               Cervix: absent               Bimanual Exam:  Uterus:  uterus absent              Adnexa: no mass, fullness, tenderness               Rectovaginal: Confirms               Anus:  normal sphincter tone, no lesions    1. Well woman exam Discussed breast self exam Discussed calcium and vit D intake Mammogram and colonoscopy UTD  2. Encounter for monitoring postmenopausal estrogen replacement therapy She wants to change from the patch. Discussed the benefit of transdermal vs oral therapy. Willing to try Divigel. Good RX coupon given - Estradiol (DIVIGEL) 0.75 MG/0.75GM GEL; Place 1 packet onto the skin daily.  Dispense: 90 each; Refill: 3  3. Vaginal bleeding No source of bleeding seen, slight roughness felt in the anterior vagina where TVT is, possible small erosion (not seen). She has only had one episode of spotting. If it recurs will try estrogen vaginal cream.  - estradiol (ESTRACE) 0.1 MG/GM vaginal cream; 1 gram vaginally qhs  x 1 week, then switch to twice weekly  Dispense: 42.5 g; Refill: 1 -no blood in her urine or incontinence (blood was in her underwear)  4. History of osteoporosis She had a bad reaction to reclast, has a very high vit D level. Primary recommend she start Prolia. Given her unexplained elevated vit D recommend referral to Endocrinology.  - Ambulatory referral to Endocrinology  5. High vitamin D level - Ambulatory referral to Endocrinology  07/11/22 Addendum: Labs from primary on 02/20/22 reviewed and sent  to be scanned.  Abnormalities include a sed rate of 25 (0-20), ALT 60 (5-40), AST 70 (7-45), vit D 148.2 (30-100) Cholesterol 248, tri 76, HDL 85, LDL 148, non-HDL 163

## 2022-07-10 ENCOUNTER — Ambulatory Visit (INDEPENDENT_AMBULATORY_CARE_PROVIDER_SITE_OTHER): Payer: BC Managed Care – PPO | Admitting: Obstetrics and Gynecology

## 2022-07-10 ENCOUNTER — Encounter: Payer: Self-pay | Admitting: Obstetrics and Gynecology

## 2022-07-10 VITALS — BP 122/74 | HR 78 | Ht 64.0 in | Wt 126.0 lb

## 2022-07-10 DIAGNOSIS — N939 Abnormal uterine and vaginal bleeding, unspecified: Secondary | ICD-10-CM

## 2022-07-10 DIAGNOSIS — Z5181 Encounter for therapeutic drug level monitoring: Secondary | ICD-10-CM

## 2022-07-10 DIAGNOSIS — Z8739 Personal history of other diseases of the musculoskeletal system and connective tissue: Secondary | ICD-10-CM | POA: Diagnosis not present

## 2022-07-10 DIAGNOSIS — E673 Hypervitaminosis D: Secondary | ICD-10-CM

## 2022-07-10 DIAGNOSIS — Z79890 Hormone replacement therapy (postmenopausal): Secondary | ICD-10-CM

## 2022-07-10 DIAGNOSIS — Z01419 Encounter for gynecological examination (general) (routine) without abnormal findings: Secondary | ICD-10-CM | POA: Diagnosis not present

## 2022-07-10 MED ORDER — ESTRADIOL 0.1 MG/GM VA CREA: TOPICAL_CREAM | VAGINAL | 1 refills | Status: AC

## 2022-07-10 MED ORDER — ESTRADIOL 0.75 MG/0.75GM TD GEL: 1.0000 | Freq: Every day | TRANSDERMAL | 3 refills | Status: AC

## 2022-07-10 NOTE — Patient Instructions (Signed)

## 2022-07-14 ENCOUNTER — Encounter: Payer: Self-pay | Admitting: Obstetrics and Gynecology

## 2022-07-14 MED ORDER — BUPROPION HCL ER (XL) 150 MG PO TB24: 150.0000 mg | ORAL_TABLET | Freq: Every day | ORAL | 3 refills | Status: AC

## 2022-07-23 ENCOUNTER — Other Ambulatory Visit: Payer: Self-pay | Admitting: Obstetrics and Gynecology

## 2022-07-23 DIAGNOSIS — Z8739 Personal history of other diseases of the musculoskeletal system and connective tissue: Secondary | ICD-10-CM

## 2022-09-26 DIAGNOSIS — M20021 Boutonniere deformity of right finger(s): Secondary | ICD-10-CM | POA: Diagnosis not present

## 2022-09-26 DIAGNOSIS — M79644 Pain in right finger(s): Secondary | ICD-10-CM | POA: Diagnosis not present

## 2022-10-15 ENCOUNTER — Other Ambulatory Visit (HOSPITAL_COMMUNITY): Payer: Self-pay | Admitting: *Deleted

## 2022-10-16 ENCOUNTER — Ambulatory Visit (HOSPITAL_COMMUNITY)
Admission: RE | Admit: 2022-10-16 | Discharge: 2022-10-16 | Disposition: A | Discharge status: Home / Self Care | Payer: BC Managed Care – PPO | Source: Ambulatory Visit | Attending: Internal Medicine | Admitting: Internal Medicine

## 2022-10-16 DIAGNOSIS — M79644 Pain in right finger(s): Secondary | ICD-10-CM | POA: Diagnosis not present

## 2022-10-16 DIAGNOSIS — M810 Age-related osteoporosis without current pathological fracture: Secondary | ICD-10-CM | POA: Insufficient documentation

## 2022-10-16 DIAGNOSIS — S62622A Displaced fracture of medial phalanx of right middle finger, initial encounter for closed fracture: Secondary | ICD-10-CM | POA: Diagnosis not present

## 2022-10-16 DIAGNOSIS — M20021 Boutonniere deformity of right finger(s): Secondary | ICD-10-CM | POA: Diagnosis not present

## 2022-10-16 MED ORDER — DENOSUMAB 60 MG/ML ~~LOC~~ SOSY
PREFILLED_SYRINGE | SUBCUTANEOUS | Status: AC
  Administered 2022-10-16: 60 mg via SUBCUTANEOUS
  Filled 2022-10-16: qty 1

## 2022-10-16 MED ORDER — DENOSUMAB 60 MG/ML ~~LOC~~ SOSY: 60.0000 mg | PREFILLED_SYRINGE | Freq: Once | SUBCUTANEOUS | Status: AC

## 2022-10-29 DIAGNOSIS — M545 Low back pain, unspecified: Secondary | ICD-10-CM | POA: Diagnosis not present

## 2022-11-04 DIAGNOSIS — M545 Low back pain, unspecified: Secondary | ICD-10-CM | POA: Diagnosis not present

## 2022-11-17 DIAGNOSIS — M545 Low back pain, unspecified: Secondary | ICD-10-CM | POA: Diagnosis not present

## 2022-11-25 DIAGNOSIS — M545 Low back pain, unspecified: Secondary | ICD-10-CM | POA: Diagnosis not present

## 2023-01-19 DIAGNOSIS — L821 Other seborrheic keratosis: Secondary | ICD-10-CM | POA: Diagnosis not present

## 2023-01-19 DIAGNOSIS — L814 Other melanin hyperpigmentation: Secondary | ICD-10-CM | POA: Diagnosis not present

## 2023-01-19 DIAGNOSIS — L570 Actinic keratosis: Secondary | ICD-10-CM | POA: Diagnosis not present

## 2023-01-19 DIAGNOSIS — Z85828 Personal history of other malignant neoplasm of skin: Secondary | ICD-10-CM | POA: Diagnosis not present

## 2023-02-18 DIAGNOSIS — M545 Low back pain, unspecified: Secondary | ICD-10-CM | POA: Diagnosis not present

## 2023-02-18 DIAGNOSIS — M542 Cervicalgia: Secondary | ICD-10-CM | POA: Diagnosis not present

## 2023-03-17 ENCOUNTER — Other Ambulatory Visit: Payer: Self-pay

## 2023-03-17 ENCOUNTER — Emergency Department (HOSPITAL_BASED_OUTPATIENT_CLINIC_OR_DEPARTMENT_OTHER)
Admission: EM | Admit: 2023-03-17 | Discharge: 2023-03-17 | Disposition: A | Discharge status: Home / Self Care | Payer: BC Managed Care – PPO | Attending: Emergency Medicine | Admitting: Emergency Medicine

## 2023-03-17 ENCOUNTER — Encounter (HOSPITAL_BASED_OUTPATIENT_CLINIC_OR_DEPARTMENT_OTHER): Payer: Self-pay | Admitting: Emergency Medicine

## 2023-03-17 DIAGNOSIS — J0100 Acute maxillary sinusitis, unspecified: Secondary | ICD-10-CM | POA: Diagnosis not present

## 2023-03-17 DIAGNOSIS — R519 Headache, unspecified: Secondary | ICD-10-CM | POA: Diagnosis not present

## 2023-03-17 MED ORDER — DOXYCYCLINE HYCLATE 100 MG PO TABS
100.0000 mg | ORAL_TABLET | Freq: Once | ORAL | Status: AC
  Administered 2023-03-17: 100 mg via ORAL
  Filled 2023-03-17: qty 1

## 2023-03-17 MED ORDER — DOXYCYCLINE HYCLATE 100 MG PO CAPS: 100.0000 mg | ORAL_CAPSULE | Freq: Two times a day (BID) | ORAL | 0 refills | Status: AC

## 2023-03-17 MED ORDER — PREDNISONE 10 MG PO TABS
60.0000 mg | ORAL_TABLET | Freq: Once | ORAL | Status: AC
  Administered 2023-03-17: 60 mg via ORAL
  Filled 2023-03-17: qty 1

## 2023-03-17 MED ORDER — PREDNISONE 20 MG PO TABS: ORAL_TABLET | ORAL | 0 refills | Status: AC

## 2023-03-17 NOTE — Discharge Instructions (Signed)
Take tylenol 2 pills 4 times a day and motrin 4 pills 3 times a day.  Drink plenty of fluids.  Return for worsening shortness of breath, headache, confusion. Follow up with your family doctor.   Follow up with your PCP and ENT in the office.

## 2023-03-17 NOTE — ED Triage Notes (Signed)
Pt via POV c/o sinus infection and facial pain x 2 weeks despite 3 different abx. Pt reports pain to left sinus area, face, teeth, jaw, and ear. Pain currently rated 5/10. No changes to hearing and no difficulty swallowing.

## 2023-03-18 NOTE — ED Provider Notes (Signed)
 [place] EMERGENCY DEPARTMENT AT [facility] Provider Note   CSN: [id number] Arrival date & time: 03/17/23  1459     History  Chief Complaint  Patient presents with   Facial Pain    Kelly Calhoun is a 53 y.o. female.  53 yo F with complaints of left facial pain.  Feels like prior sinusitis.  Going on for 3 weeks.  Had called the PCP and was prescribed antibiotics.  Has not been able to see anybody for this.  Asked for referral to ENT from the PCP but was unable to get 1 and so came here for evaluation.  She also stopped drinking alcohol  about 3 days ago and feels little bit shaky.        Home Medications Prior to Admission medications   Medication Sig Start Date End Date Taking? Authorizing Provider  doxycycline  (VIBRAMYCIN ) 100 MG capsule Take 1 capsule (100 mg total) by mouth 2 (two) times daily. One po bid x 7 days 03/17/23  Yes Michon Kaczmarek, DO  predniSONE  (DELTASONE ) 20 MG tablet 2 tabs po daily x 4 days 03/17/23  Yes Emil Share, DO  buPROPion  (WELLBUTRIN  XL) 150 MG 24 hr tablet Take 1 tablet (150 mg total) by mouth daily. 07/14/22 07/14/23  Jertson, Jill Evelyn, MD  Estradiol  (DIVIGEL ) 0.75 MG/0.75GM GEL Place 1 packet onto the skin daily. 07/10/22   Jertson, Jill Evelyn, MD  estradiol  (ESTRACE ) 0.1 MG/GM vaginal cream 1 gram vaginally qhs x 1 week, then switch to twice weekly 07/10/22   Jertson, Jill Evelyn, MD      Allergies    Amoxicillin    Review of Systems   Review of Systems  Physical Exam Updated Vital Signs BP (!) 142/99   Pulse 99   Temp 97.9 F (36.6 C) (Oral)   Resp 18   Ht 5' 4 (1.626 m)   Wt 58.1 kg   LMP 09/09/2015   SpO2 100%   BMI 21.97 kg/m  Physical Exam Vitals and nursing note reviewed.  Constitutional:      General: She is not in acute distress.    Appearance: She is well-developed. She is not diaphoretic.  HENT:     Head: Normocephalic and atraumatic.     Comments: Swollen turbinates, posterior nasal drip, mild maxillary sinus TTP  on the left, tm normal bilaterally.   Eyes:     Pupils: Pupils are equal, round, and reactive to light.  Cardiovascular:     Rate and Rhythm: Normal rate and regular rhythm.     Heart sounds: No murmur heard.    No friction rub. No gallop.  Pulmonary:     Effort: Pulmonary effort is normal.     Breath sounds: No wheezing or rales.  Abdominal:     General: There is no distension.     Palpations: Abdomen is soft.     Tenderness: There is no abdominal tenderness.  Musculoskeletal:        General: No tenderness.     Cervical back: Normal range of motion and neck supple.  Skin:    General: Skin is warm and dry.  Neurological:     Mental Status: She is alert and oriented to person, place, and time.  Psychiatric:        Behavior: Behavior normal.     ED Results / Procedures / Treatments   Labs (all labs ordered are listed, but only abnormal results are displayed) Labs Reviewed - No data to display  EKG None  Radiology No results found.  Procedures Procedures    Medications Ordered in ED Medications  doxycycline  (VIBRA -TABS) tablet 100 mg (100 mg Oral Given 03/17/23 2326)  predniSONE  (DELTASONE ) tablet 60 mg (60 mg Oral Given 03/17/23 2325)    ED Course/ Medical Decision Making/ A&P                                 Medical Decision Making Risk Prescription drug management.   53 yo F with a chief complaints of left-sided facial pain.  Going on for about 3 weeks.  She is worried that she has a sinus infection.  She had some improvement on an antibiotic but she is not sure which one it was that she was started on.  She is well-appearing nontoxic.  I will start her on oral antibiotics.  Have her follow-up with PCP in the office.  Given referral to ENT.   12:17 AM:  I have discussed the diagnosis/risks/treatment options with the patient.  Evaluation and diagnostic testing in the emergency department does not suggest an emergent condition requiring admission or immediate  intervention beyond what has been performed at this time.  They will follow up with PCP, ENT. We also discussed returning to the ED immediately if new or worsening sx occur. We discussed the sx which are most concerning (e.g., sudden worsening pain, fever, inability to tolerate by mouth) that necessitate immediate return. Medications administered to the patient during their visit and any new prescriptions provided to the patient are listed below.  Medications given during this visit Medications  doxycycline  (VIBRA -TABS) tablet 100 mg (100 mg Oral Given 03/17/23 2326)  predniSONE  (DELTASONE ) tablet 60 mg (60 mg Oral Given 03/17/23 2325)     The patient appears reasonably screen and/or stabilized for discharge and I doubt any other medical condition or other [facility] requiring further screening, evaluation, or treatment in the ED at this time prior to discharge.          Final Clinical Impression(s) / ED Diagnoses Final diagnoses:  Subacute maxillary sinusitis    Rx / DC Orders ED Discharge Orders          Ordered    doxycycline  (VIBRAMYCIN ) 100 MG capsule  2 times daily        03/17/23 2319    predniSONE  (DELTASONE ) 20 MG tablet        03/17/23 2319              Emil Share, DO 03/18/23 0017

## 2023-03-19 ENCOUNTER — Other Ambulatory Visit: Payer: Self-pay | Admitting: Internal Medicine

## 2023-03-19 DIAGNOSIS — Z1231 Encounter for screening mammogram for malignant neoplasm of breast: Secondary | ICD-10-CM

## 2023-03-23 DIAGNOSIS — N951 Menopausal and female climacteric states: Secondary | ICD-10-CM | POA: Diagnosis not present

## 2023-03-23 DIAGNOSIS — Z1329 Encounter for screening for other suspected endocrine disorder: Secondary | ICD-10-CM | POA: Diagnosis not present

## 2023-03-25 DIAGNOSIS — M79661 Pain in right lower leg: Secondary | ICD-10-CM | POA: Diagnosis not present

## 2023-03-25 DIAGNOSIS — R252 Cramp and spasm: Secondary | ICD-10-CM | POA: Diagnosis not present

## 2023-03-25 DIAGNOSIS — M79662 Pain in left lower leg: Secondary | ICD-10-CM | POA: Diagnosis not present

## 2023-03-25 DIAGNOSIS — I872 Venous insufficiency (chronic) (peripheral): Secondary | ICD-10-CM | POA: Diagnosis not present

## 2023-03-25 DIAGNOSIS — R600 Localized edema: Secondary | ICD-10-CM | POA: Diagnosis not present

## 2023-03-27 DIAGNOSIS — R6882 Decreased libido: Secondary | ICD-10-CM | POA: Diagnosis not present

## 2023-03-27 DIAGNOSIS — N951 Menopausal and female climacteric states: Secondary | ICD-10-CM | POA: Diagnosis not present

## 2023-03-27 DIAGNOSIS — Z79890 Hormone replacement therapy (postmenopausal): Secondary | ICD-10-CM | POA: Diagnosis not present

## 2023-03-27 DIAGNOSIS — Z6822 Body mass index (BMI) 22.0-22.9, adult: Secondary | ICD-10-CM | POA: Diagnosis not present

## 2023-04-10 ENCOUNTER — Ambulatory Visit
Admission: RE | Admit: 2023-04-10 | Discharge: 2023-04-10 | Disposition: A | Discharge status: Home / Self Care | Payer: BC Managed Care – PPO | Source: Ambulatory Visit | Attending: Internal Medicine | Admitting: Internal Medicine

## 2023-04-10 DIAGNOSIS — Z1231 Encounter for screening mammogram for malignant neoplasm of breast: Secondary | ICD-10-CM | POA: Diagnosis not present

## 2023-04-21 ENCOUNTER — Telehealth: Payer: Self-pay | Admitting: Obstetrics and Gynecology

## 2023-04-21 NOTE — Telephone Encounter (Signed)
 Please let patient know that the prior authorization for the transdermal estradiol gel was denied by her insurance company.  It is not on her formulary.   It states she needs to try and fail or be unable to take all formulary alternatives.   The options are oral estradiol tablets or consider another patch type, such as Minivelle, which may adhere better?  Please let me know if she would like to try.  She can also ask for an appeal to the decision made by her insurance company.

## 2023-04-22 NOTE — Telephone Encounter (Signed)
 Call placed to patient, left detailed message, ok per dpr. Advised as seen below per Dr. Edward Jolly. Return call to advise how you want to proceed, [phone], opt 4.

## 2023-04-24 ENCOUNTER — Other Ambulatory Visit (HOSPITAL_COMMUNITY): Payer: Self-pay

## 2023-04-27 ENCOUNTER — Ambulatory Visit (HOSPITAL_COMMUNITY)
Admission: RE | Admit: 2023-04-27 | Discharge: 2023-04-27 | Disposition: A | Discharge status: Home / Self Care | Source: Ambulatory Visit | Attending: Internal Medicine | Admitting: Internal Medicine

## 2023-04-27 DIAGNOSIS — M810 Age-related osteoporosis without current pathological fracture: Secondary | ICD-10-CM | POA: Diagnosis not present

## 2023-04-27 MED ORDER — DENOSUMAB 60 MG/ML ~~LOC~~ SOSY
PREFILLED_SYRINGE | SUBCUTANEOUS | Status: AC
  Filled 2023-04-27: qty 1

## 2023-04-27 MED ORDER — DENOSUMAB 60 MG/ML ~~LOC~~ SOSY
60.0000 mg | PREFILLED_SYRINGE | Freq: Once | SUBCUTANEOUS | Status: AC
  Administered 2023-04-27: 60 mg via SUBCUTANEOUS

## 2023-04-30 DIAGNOSIS — N951 Menopausal and female climacteric states: Secondary | ICD-10-CM | POA: Diagnosis not present

## 2023-05-01 NOTE — Telephone Encounter (Signed)
 2nd call placed to patient, left detailed message, ok per dpr. Advised as seen below per Dr. Edward Jolly. Return call to advise how you want to proceed, [phone], opt 4.

## 2023-05-04 ENCOUNTER — Telehealth (INDEPENDENT_AMBULATORY_CARE_PROVIDER_SITE_OTHER): Payer: Self-pay | Admitting: Otolaryngology

## 2023-05-04 DIAGNOSIS — R6882 Decreased libido: Secondary | ICD-10-CM | POA: Diagnosis not present

## 2023-05-04 DIAGNOSIS — N951 Menopausal and female climacteric states: Secondary | ICD-10-CM | POA: Diagnosis not present

## 2023-05-04 DIAGNOSIS — G479 Sleep disorder, unspecified: Secondary | ICD-10-CM | POA: Diagnosis not present

## 2023-05-04 DIAGNOSIS — Z6821 Body mass index (BMI) 21.0-21.9, adult: Secondary | ICD-10-CM | POA: Diagnosis not present

## 2023-05-04 NOTE — Telephone Encounter (Signed)
 Reminder Call:  Date: 05/05/2023 Status: Sch  Time: 3:20 PM Left Voicemail message w/time and location-[address] [place], [place] [address]

## 2023-05-05 ENCOUNTER — Institutional Professional Consult (permissible substitution) (INDEPENDENT_AMBULATORY_CARE_PROVIDER_SITE_OTHER): Payer: BC Managed Care – PPO

## 2023-05-11 NOTE — Telephone Encounter (Signed)
 Encounter reviewed and closed.   Will await patient return call to the office.

## 2023-06-12 IMAGING — MG DIGITAL SCREENING BREAST BILAT IMPLANT W/ TOMO W/ CAD
9 of 12 series · 9 of 28 positions shown · non-contrast
Comparison: Previous exam(s).

CLINICAL DATA: Screening.

EXAM:
DIGITAL SCREENING BILATERAL MAMMOGRAM WITH IMPLANTS, CAD AND
TOMOSYNTHESIS
TECHNIQUE: Bilateral screening digital craniocaudal and mediolateral oblique
mammograms were obtained. Bilateral screening digital breast
tomosynthesis was performed. The images were evaluated with
computer-aided detection. Standard and/or implant displaced views
were performed.

[L MLO]
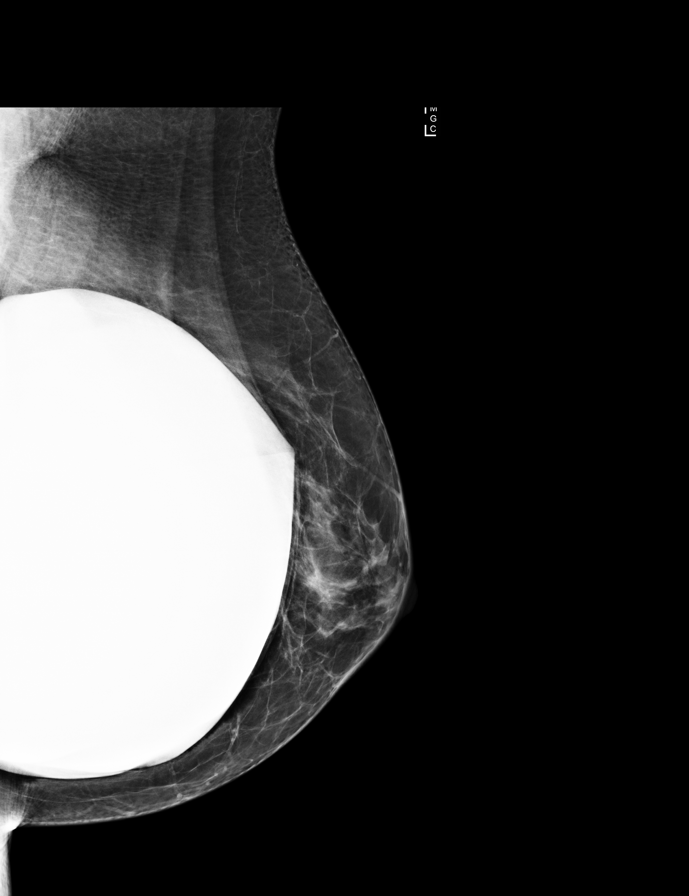

[R MLO]
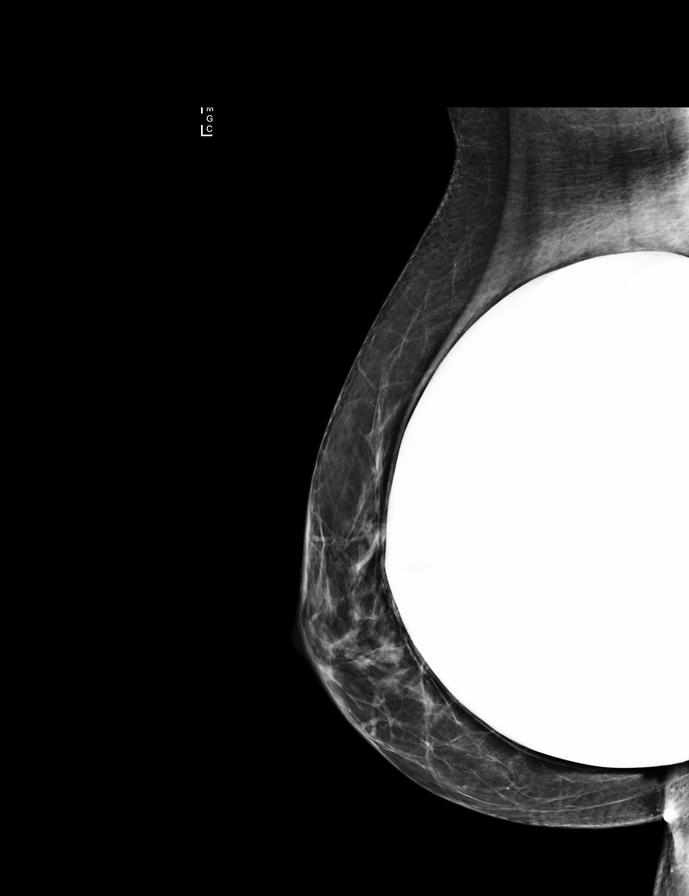

[R CC]
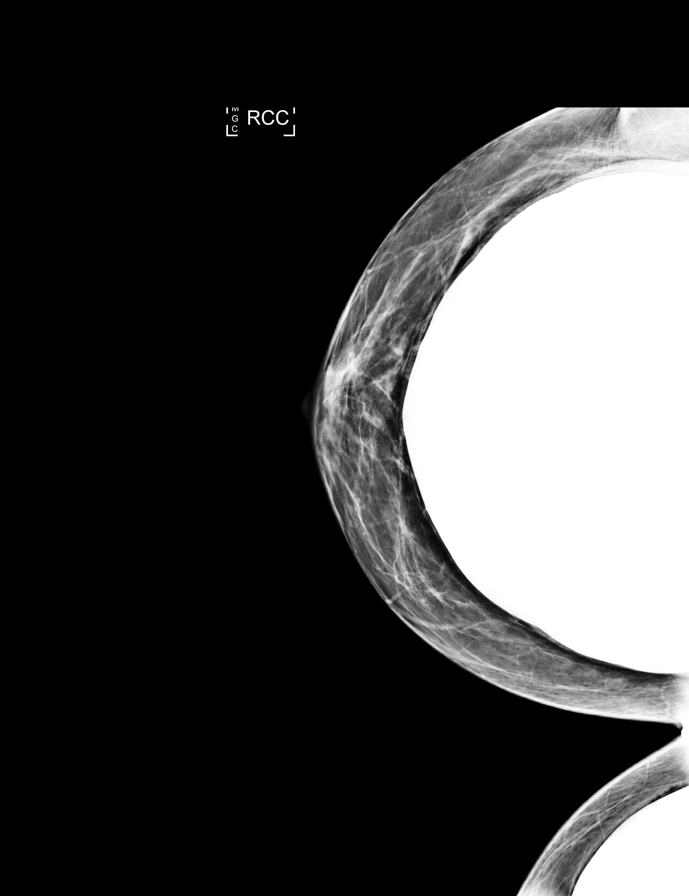

[L CC]
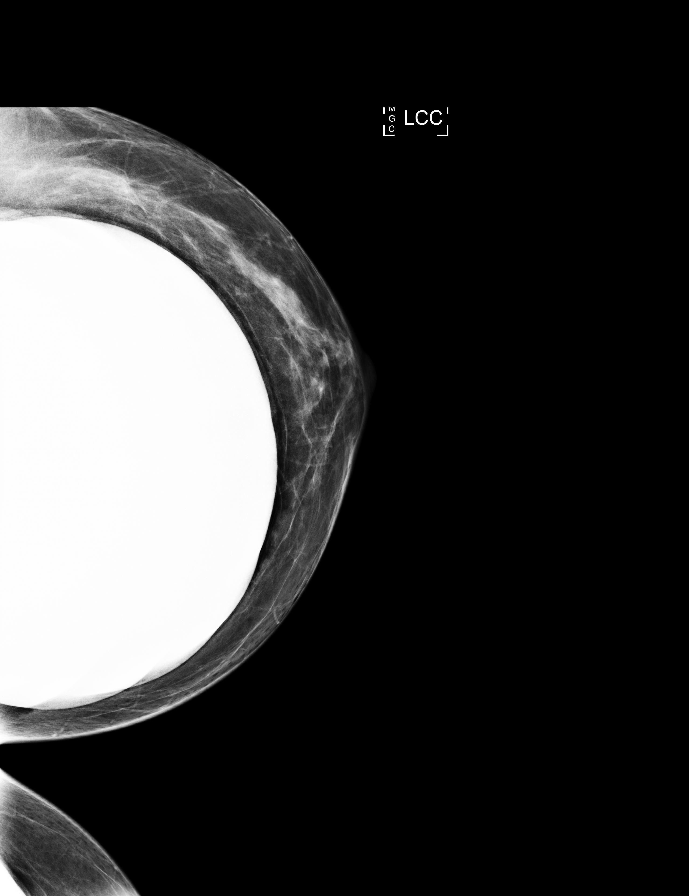

[L MLO synth-2D]
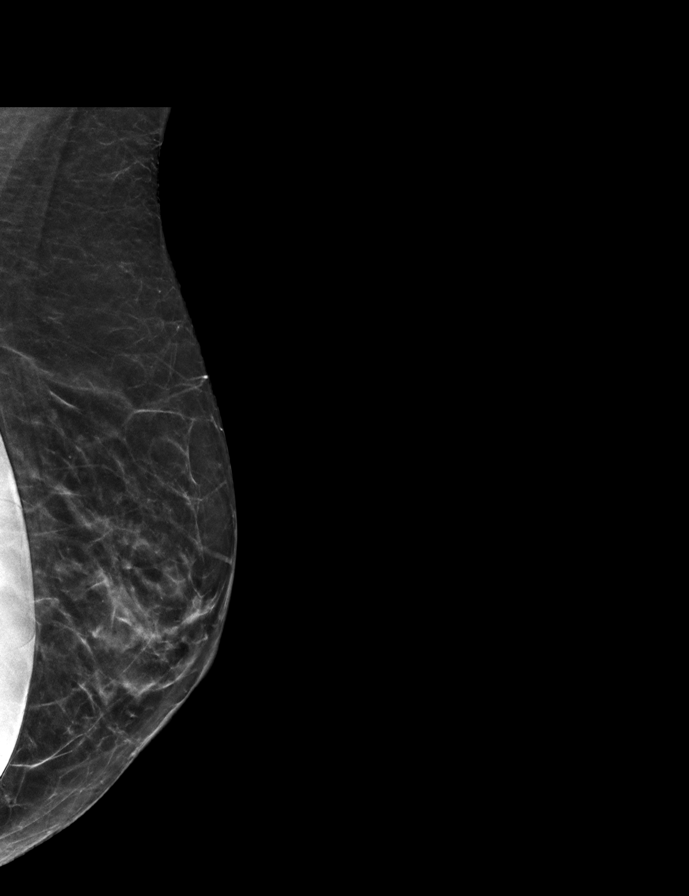

[L CC synth-2D]
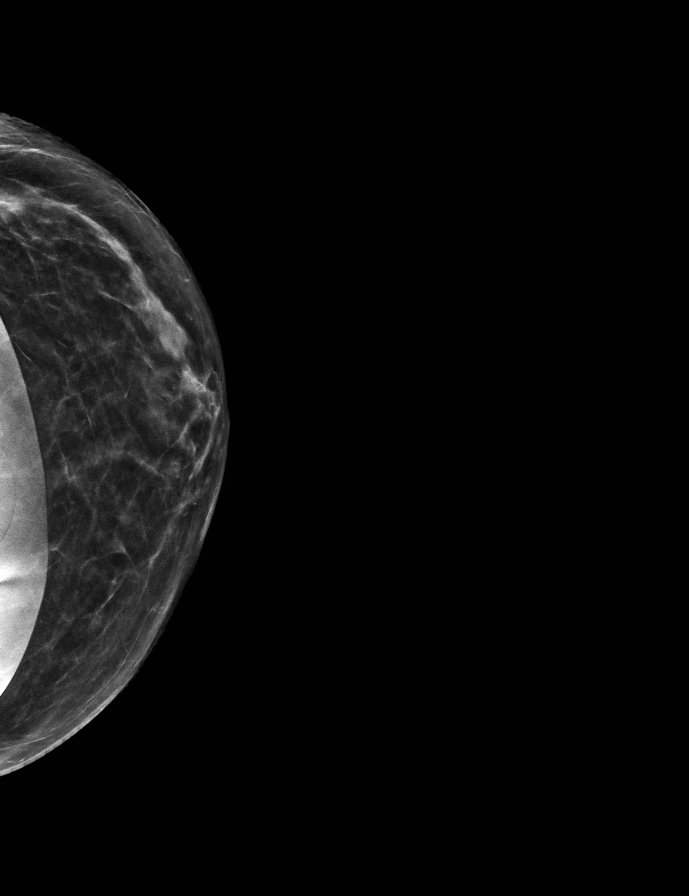

[R MLO synth-2D]
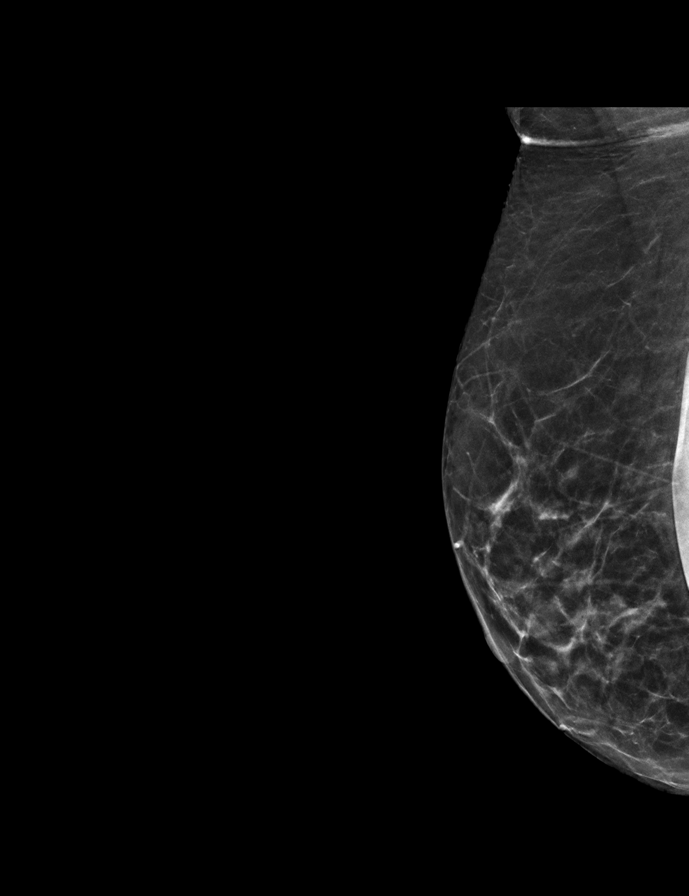

[R CC synth-2D]
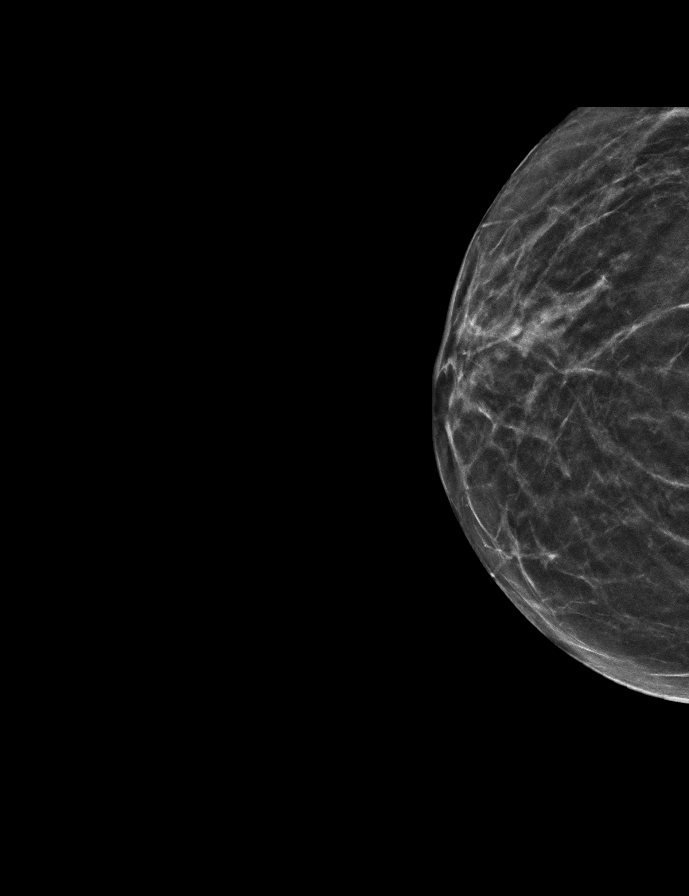

[R MLOID BREAST TOMOSYNTHESIS IMAGE tomo · tomo slice 25/48.0]
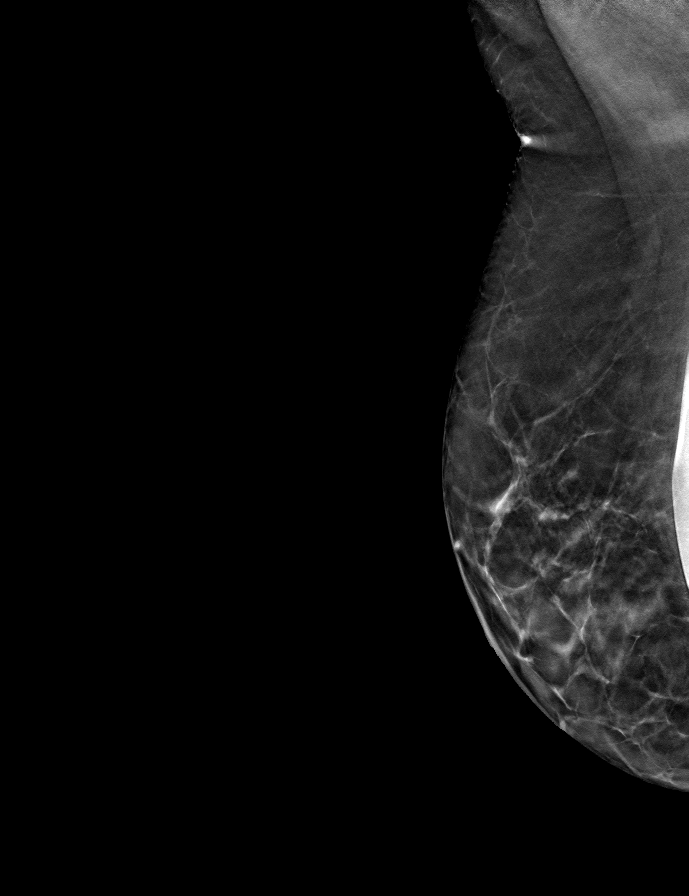

[9 of 28 positions shown; findings below may reference images not displayed]

ACR Breast Density Category b: There are scattered areas of
fibroglandular density.
FINDINGS: The patient has implants. There are no findings suspicious for
malignancy.
IMPRESSION: No mammographic evidence of malignancy. A result letter of this
screening mammogram will be mailed directly to the patient.

RECOMMENDATION:
Screening mammogram in one year. (Code:3J-K-2IG)

BI-RADS CATEGORY  1:  Negative.

## 2023-07-07 DIAGNOSIS — Z79899 Other long term (current) drug therapy: Secondary | ICD-10-CM | POA: Diagnosis not present

## 2023-07-07 DIAGNOSIS — E7800 Pure hypercholesterolemia, unspecified: Secondary | ICD-10-CM | POA: Diagnosis not present

## 2023-07-07 DIAGNOSIS — R7301 Impaired fasting glucose: Secondary | ICD-10-CM | POA: Diagnosis not present

## 2023-07-13 DIAGNOSIS — E7800 Pure hypercholesterolemia, unspecified: Secondary | ICD-10-CM | POA: Diagnosis not present

## 2023-07-13 DIAGNOSIS — Z1339 Encounter for screening examination for other mental health and behavioral disorders: Secondary | ICD-10-CM | POA: Diagnosis not present

## 2023-07-13 DIAGNOSIS — Z0000 Encounter for general adult medical examination without abnormal findings: Secondary | ICD-10-CM | POA: Diagnosis not present

## 2023-07-13 DIAGNOSIS — Z1331 Encounter for screening for depression: Secondary | ICD-10-CM | POA: Diagnosis not present

## 2023-11-09 DIAGNOSIS — M810 Age-related osteoporosis without current pathological fracture: Secondary | ICD-10-CM | POA: Diagnosis not present

## 2023-11-12 DIAGNOSIS — Z01419 Encounter for gynecological examination (general) (routine) without abnormal findings: Secondary | ICD-10-CM | POA: Diagnosis not present

## 2023-11-12 DIAGNOSIS — Z6823 Body mass index (BMI) 23.0-23.9, adult: Secondary | ICD-10-CM | POA: Diagnosis not present

## 2023-11-23 DIAGNOSIS — M810 Age-related osteoporosis without current pathological fracture: Secondary | ICD-10-CM | POA: Diagnosis not present

## 2023-11-23 DIAGNOSIS — E7800 Pure hypercholesterolemia, unspecified: Secondary | ICD-10-CM | POA: Diagnosis not present

## 2024-01-21 DIAGNOSIS — D225 Melanocytic nevi of trunk: Secondary | ICD-10-CM | POA: Diagnosis not present

## 2024-01-21 DIAGNOSIS — L814 Other melanin hyperpigmentation: Secondary | ICD-10-CM | POA: Diagnosis not present

## 2024-01-21 DIAGNOSIS — D485 Neoplasm of uncertain behavior of skin: Secondary | ICD-10-CM | POA: Diagnosis not present

## 2024-01-21 DIAGNOSIS — L570 Actinic keratosis: Secondary | ICD-10-CM | POA: Diagnosis not present

## 2024-01-21 DIAGNOSIS — Z85828 Personal history of other malignant neoplasm of skin: Secondary | ICD-10-CM | POA: Diagnosis not present

## 2024-01-21 DIAGNOSIS — D0472 Carcinoma in situ of skin of left lower limb, including hip: Secondary | ICD-10-CM | POA: Diagnosis not present
